# Patient Record
Sex: Female | Born: 1985 | Race: White | Hispanic: No | Marital: Married | Smoking: Never smoker
Health system: Southern US, Community
[De-identification: ages and names within clinical notes are randomized; demographics above are authoritative.]

## PROBLEM LIST (undated history)

## (undated) DIAGNOSIS — N200 Calculus of kidney: Secondary | ICD-10-CM

## (undated) DIAGNOSIS — R569 Unspecified convulsions: Secondary | ICD-10-CM

## (undated) DIAGNOSIS — Z3490 Encounter for supervision of normal pregnancy, unspecified, unspecified trimester: Secondary | ICD-10-CM

## (undated) DIAGNOSIS — M1990 Unspecified osteoarthritis, unspecified site: Secondary | ICD-10-CM

## (undated) DIAGNOSIS — K219 Gastro-esophageal reflux disease without esophagitis: Secondary | ICD-10-CM

## (undated) HISTORY — DX: Unspecified convulsions: R56.9

## (undated) HISTORY — DX: Calculus of kidney: N20.0

## (undated) HISTORY — PX: HERNIA REPAIR: SHX51

## (undated) HISTORY — PX: WISDOM TOOTH EXTRACTION: SHX21

## (undated) HISTORY — PX: EYE SURGERY: SHX253

## (undated) HISTORY — DX: Unspecified osteoarthritis, unspecified site: M19.90

---

## 2008-09-20 ENCOUNTER — Encounter: Admission: RE | Admit: 2008-09-20 | Discharge: 2008-09-20 | Payer: Self-pay | Admitting: Family Medicine

## 2009-03-30 ENCOUNTER — Ambulatory Visit: Payer: Self-pay | Admitting: Internal Medicine

## 2009-03-30 DIAGNOSIS — R209 Unspecified disturbances of skin sensation: Secondary | ICD-10-CM | POA: Insufficient documentation

## 2009-03-30 DIAGNOSIS — M064 Inflammatory polyarthropathy: Secondary | ICD-10-CM | POA: Insufficient documentation

## 2009-03-30 DIAGNOSIS — R259 Unspecified abnormal involuntary movements: Secondary | ICD-10-CM | POA: Insufficient documentation

## 2009-03-30 LAB — CONVERTED CEMR LAB
Albumin: 4.5 g/dL (ref 3.5–5.2)
Alkaline Phosphatase: 55 units/L (ref 39–117)
BUN: 12 mg/dL (ref 6–23)
CO2: 18 meq/L — ABNORMAL LOW (ref 19–32)
CRP: 0.1 mg/dL (ref <0.6)
Calcium: 9.5 mg/dL (ref 8.4–10.5)
Eosinophils Absolute: 0 10*3/uL (ref 0.0–0.7)
Eosinophils Relative: 0.8 % (ref 0.0–5.0)
Folate: 8.6 ng/mL
Glucose, Bld: 76 mg/dL (ref 70–99)
Lymphocytes Relative: 27.6 % (ref 12.0–46.0)
MCHC: 32.5 g/dL (ref 30.0–36.0)
MCV: 96.6 fL (ref 78.0–100.0)
Monocytes Absolute: 0.5 10*3/uL (ref 0.1–1.0)
Neutrophils Relative %: 62.8 % (ref 43.0–77.0)
Platelets: 159 10*3/uL (ref 150.0–400.0)
Sodium: 139 meq/L (ref 135–145)
TSH: 0.61 microintl units/mL (ref 0.35–5.50)
Total Bilirubin: 0.6 mg/dL (ref 0.3–1.2)
Total Protein: 7.6 g/dL (ref 6.0–8.3)
Vitamin B-12: 349 pg/mL (ref 211–911)
WBC: 6 10*3/uL (ref 4.5–10.5)

## 2009-04-02 ENCOUNTER — Encounter: Payer: Self-pay | Admitting: Internal Medicine

## 2009-04-10 ENCOUNTER — Ambulatory Visit (HOSPITAL_COMMUNITY): Admission: RE | Admit: 2009-04-10 | Discharge: 2009-04-10 | Payer: Self-pay | Admitting: Internal Medicine

## 2009-05-04 ENCOUNTER — Ambulatory Visit: Payer: Self-pay | Admitting: Internal Medicine

## 2010-04-16 NOTE — Assessment & Plan Note (Signed)
Summary: NEW/ [employer] /NWS #   Vital Signs:  Patient profile:   25 year old female Menstrual status:  regular LMP:     03/29/2009 Height:      69 inches Weight:      129 pounds BMI:     19.12 O2 Sat:      98 % on Room air Temp:     98.1 degrees F oral Pulse rate:   78 / minute Pulse rhythm:   regular Resp:     16 per minute BP sitting:   110 / 74  (left arm) Cuff size:   regular  Vitals Entered By: Rock Nephew CMA (March 30, 2009 3:07 PM)  O2 Flow:  Room air LMP (date): 03/29/2009     Menstrual Status regular Enter LMP: 03/29/2009 Last PAP Result abnormal   Primary Care Provider:  Etta Grandchild MD   History of Present Illness: New to me she complains of symptoms since 2008 when she saw a Neurologist and Sports Med . doctor for "growth spurts" and joint pain. She reports having a normal NCS done and a neg. Rheumatoid Factor. She was treated with Meloxicam and Lyrica but has gone on to develop burning under her skin with weakness, numbness, incoordination, and tingling.  5 days prior to this visit she developed "tremors' that migrated from her right hand and foot over to the left side of her body. Those tremors resolved 2 days ago and have not returned.  Preventive Screening-Counseling & Management  Alcohol-Tobacco     Alcohol drinks/day: 0     Smoking Status: never  Caffeine-Diet-Exercise     Does Patient Exercise: yes  Hep-HIV-STD-Contraception     Hepatitis Risk: no risk noted     HIV Risk: no risk noted     STD Risk: no risk noted      Sexual History:  currently monogamous.        Drug Use:  no.        Blood Transfusions:  no.    Current Medications (verified): 1)  Aviane 0.1-20 Mg-Mcg Tabs (Levonorgestrel-Ethinyl Estrad)  Allergies (verified): No Known Drug Allergies  Past History:  Past Medical History: Unremarkable  Past Surgical History: Inguinal herniorrhaphy Eye surgery  Family History: Family History of Arthritis Family History  Osteoporosis  Social History: Occupation: Environmental health practitioner Single Never Smoked Alcohol use-no Drug use-no Regular exercise-yes Smoking Status:  never Hepatitis Risk:  no risk noted HIV Risk:  no risk noted STD Risk:  no risk noted Sexual History:  currently monogamous Blood Transfusions:  no Drug Use:  no Does Patient Exercise:  yes  Review of Systems  The patient denies anorexia, fever, weight loss, weight gain, chest pain, syncope, dyspnea on exertion, peripheral edema, prolonged cough, headaches, hemoptysis, abdominal pain, difficulty walking, depression, and enlarged lymph nodes.   MS:  Complains of joint pain; denies joint redness, joint swelling, loss of strength, low back pain, muscle aches, muscle, muscle weakness, and stiffness. Neuro:  Complains of disturbances in coordination, numbness, poor balance, tingling, tremors, and weakness; denies headaches, memory loss, seizures, sensation of room spinning, and visual disturbances.  Physical Exam  General:  alert, well-developed, well-nourished, well-hydrated, appropriate dress, normal appearance, healthy-appearing, cooperative to examination, and good hygiene.   Head:  normocephalic, atraumatic, no abnormalities observed, no abnormalities palpated, and no alopecia.   Eyes:  vision grossly intact, pupils equal, pupils round, and pupils reactive to light.   Ears:  R ear normal and L ear normal.  Mouth:  good dentition, no gingival abnormalities, no dental plaque, pharynx pink and moist, no erythema, no exudates, no posterior lymphoid hypertrophy, and no postnasal drip.   Neck:  supple, full ROM, no masses, no thyromegaly, no thyroid nodules or tenderness, no JVD, normal carotid upstroke, no carotid bruits, no cervical lymphadenopathy, and no neck tenderness.   Lungs:  normal respiratory effort, no intercostal retractions, no accessory muscle use, normal breath sounds, and no dullness.   Heart:  normal rate, regular  rhythm, no murmur, no gallop, no rub, and no JVD.   Abdomen:  soft, non-tender, normal bowel sounds, no distention, no masses, no guarding, no rigidity, no hepatomegaly, and no splenomegaly.   Msk:  normal ROM, no joint tenderness, no joint swelling, no joint warmth, no redness over joints, no joint deformities, no joint instability, and no crepitation.   Pulses:  R and L carotid,radial,femoral,dorsalis pedis and posterior tibial pulses are full and equal bilaterally Extremities:  No clubbing, cyanosis, edema, or deformity noted with normal full range of motion of all joints.   Neurologic:  No cranial nerve deficits noted. Station and gait are normal. Plantar reflexes are down-going bilaterally. DTRs are symmetrical throughout. Sensory, motor and coordinative functions appear intact. Skin:  turgor normal, color normal, no rashes, no suspicious lesions, no ecchymoses, no petechiae, no purpura, no ulcerations, and no edema.   Cervical Nodes:  no anterior cervical adenopathy and no posterior cervical adenopathy.   Axillary Nodes:  no R axillary adenopathy and no L axillary adenopathy.   Inguinal Nodes:  no R inguinal adenopathy and no L inguinal adenopathy.   Psych:  Oriented X3, memory intact for recent and remote, normally interactive, good eye contact, not depressed appearing, not agitated, not suicidal, not homicidal, and slightly anxious.     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) Assessment: New will look for inflammatory disease Orders: Venipuncture (54098) TLB-BMP (Basic Metabolic Panel-BMET) (80048-METABOL) TLB-CBC Platelet - w/Differential (85025-CBCD) TLB-Hepatic/Liver Function Pnl (80076-HEPATIC) TLB-TSH (Thyroid Stimulating Hormone) (84443-TSH) TLB-B12 + Folate Pnl (11914_78295-A21/HYQ) TLB-CRP-High Sensitivity  (C-Reactive Protein) (86140-FCRP) T-Antinuclear Antib (ANA) (65784-69629) TLB-Rheumatoid Factor (RA) (52841-LK) T-Ceruloplasmin (44010-27253)  Problem # 3:  PARESTHESIA (ICD-782.0) Assessment: New I am concerned about B12 deficiency, thyroid disease, MS, Wilson's Dz., abnormal lytes Orders: Venipuncture (66440) TLB-BMP (Basic Metabolic Panel-BMET) (80048-METABOL) TLB-CBC Platelet - w/Differential (85025-CBCD) TLB-Hepatic/Liver Function Pnl (80076-HEPATIC) TLB-TSH (Thyroid Stimulating Hormone) (84443-TSH) TLB-B12 + Folate Pnl (34742_59563-O75/IEP) TLB-CRP-High Sensitivity (C-Reactive Protein) (86140-FCRP) T-Antinuclear Antib (ANA) (32951-88416) TLB-Rheumatoid Factor (RA) (60630-ZS) T-Ceruloplasmin (01093-23557) Radiology Referral (Radiology)  Complete Medication List: 1)  Aviane 0.1-20 Mg-mcg Tabs (Levonorgestrel-ethinyl estrad)  Patient Instructions: 1)  Please schedule a follow-up appointment in 2 weeks.  Preventive Care Screening  Pap Smear:    Date:  07/15/2008    Results:  abnormal   Last Tetanus Booster:    Date:  03/17/1998    Results:  Historical    Appended Document: Orders Update    Clinical Lists Changes  Orders: Added new Test order of T- * Misc. Laboratory test [phone]) - Signed

## 2010-04-16 NOTE — Assessment & Plan Note (Signed)
Summary: GO OVER MRI AND LAB RESULTS/NWS  #   Vital Signs:  Patient profile:   25 year old female Menstrual status:  regular Height:      69 inches Weight:      124 pounds O2 Sat:      98 % on Room air Temp:     97.4 degrees F oral Pulse rate:   97 / minute Pulse rhythm:   regular Resp:     16 per minute BP sitting:   120 / 60  (left arm) Cuff size:   regular  Vitals Entered By: Rock Nephew CMA (May 04, 2009 10:32 AM)  O2 Flow:  Room air  Primary Care Provider:  Etta Grandchild MD   History of Present Illness: She returns for f/up, all testing so far has been normal. She has had some LBP and reports a sensation of warmth under her skin and feeling "stressed". She saw a holistic doctor yesterday who did extensive testing.  Preventive Screening-Counseling & Management  Alcohol-Tobacco     Alcohol drinks/day: 0     Smoking Status: never  Hep-HIV-STD-Contraception     Hepatitis Risk: no risk noted     HIV Risk: no risk noted     STD Risk: no risk noted      Sexual History:  currently monogamous.        Drug Use:  no.        Blood Transfusions:  no.    Clinical Review Panels:  Diabetes Management   Creatinine:  0.83 (03/30/2009)  CBC   WBC:  6.0 (03/30/2009)   RBC:  4.77 (03/30/2009)   Hgb:  15.0 (03/30/2009)   Hct:  46.1 (03/30/2009)   Platelets:  159.0 (03/30/2009)   MCV  96.6 (03/30/2009)   MCHC  32.5 (03/30/2009)   RDW  12.3 (03/30/2009)   PMN:  62.8 (03/30/2009)   Lymphs:  27.6 (03/30/2009)   Monos:  8.2 (03/30/2009)   Eosinophils:  0.8 (03/30/2009)   Basophil:  0.6 (03/30/2009)  Complete Metabolic Panel   Glucose:  76 (03/30/2009)   Sodium:  139 (03/30/2009)   Potassium:  4.2 (03/30/2009)   Chloride:  105 (03/30/2009)   CO2:  18 (03/30/2009)   BUN:  12 (03/30/2009)   Creatinine:  0.83 (03/30/2009)   Albumin:  4.5 (03/30/2009)   Total Protein:  7.6 (03/30/2009)   Calcium:  9.5 (03/30/2009)   Total Bili:  0.6 (03/30/2009)   Alk Phos:   55 (03/30/2009)   SGPT (ALT):  11 (03/30/2009)   SGOT (AST):  19 (03/30/2009)   Medications Prior to Update: 1)  Aviane 0.1-20 Mg-Mcg Tabs (Levonorgestrel-Ethinyl Estrad)  Current Medications (verified): 1)  Aviane 0.1-20 Mg-Mcg Tabs (Levonorgestrel-Ethinyl Estrad)  Allergies (verified): No Known Drug Allergies  Past History:  Past Medical History: Reviewed history from 03/30/2009 and no changes required. Unremarkable  Past Surgical History: Reviewed history from 03/30/2009 and no changes required. Inguinal herniorrhaphy Eye surgery  Family History: Reviewed history from 03/30/2009 and no changes required. Family History of Arthritis Family History Osteoporosis  Social History: Reviewed history from 03/30/2009 and no changes required. Occupation: Environmental health practitioner Single Never Smoked Alcohol use-no Drug use-no Regular exercise-yes  Review of Systems  The patient denies anorexia, fever, weight loss, weight gain, chest pain, syncope, dyspnea on exertion, prolonged cough, headaches, hemoptysis, abdominal pain, suspicious skin lesions, transient blindness, difficulty walking, depression, enlarged lymph nodes, and angioedema.   MS:  Complains of joint pain, low back pain, and muscle aches; denies  joint redness, joint swelling, loss of strength, muscle weakness, stiffness, and thoracic pain. Neuro:  Denies brief paralysis, difficulty with concentration, disturbances in coordination, falling down, headaches, inability to speak, numbness, poor balance, sensation of room spinning, tingling, tremors, visual disturbances, and weakness. Psych:  Complains of anxiety; denies alternate hallucination ( auditory/visual), depression, easily angered, easily tearful, irritability, panic attacks, sense of great danger, suicidal thoughts/plans, thoughts of violence, unusual visions or sounds, and thoughts /plans of harming others.  Physical Exam  General:  alert, well-developed,  well-nourished, well-hydrated, appropriate dress, normal appearance, healthy-appearing, cooperative to examination, and good hygiene.   Head:  normocephalic, atraumatic, no abnormalities observed, no abnormalities palpated, and no alopecia.   Eyes:  vision grossly intact, pupils equal, pupils round, and pupils reactive to light.   Mouth:  good dentition, no gingival abnormalities, no dental plaque, pharynx pink and moist, no erythema, no exudates, no posterior lymphoid hypertrophy, and no postnasal drip.   Neck:  supple, full ROM, no masses, no thyromegaly, no thyroid nodules or tenderness, no JVD, normal carotid upstroke, no carotid bruits, no cervical lymphadenopathy, and no neck tenderness.   Lungs:  normal respiratory effort, no intercostal retractions, no accessory muscle use, normal breath sounds, and no dullness.   Heart:  normal rate, regular rhythm, no murmur, no gallop, no rub, and no JVD.   Abdomen:  soft, non-tender, normal bowel sounds, no distention, no masses, no guarding, no rigidity, no hepatomegaly, and no splenomegaly.   Msk:  normal ROM, no joint tenderness, no joint swelling, no joint warmth, no redness over joints, no joint deformities, no joint instability, and no crepitation.   Pulses:  R and L carotid,radial,femoral,dorsalis pedis and posterior tibial pulses are full and equal bilaterally Extremities:  No clubbing, cyanosis, edema, or deformity noted with normal full range of motion of all joints.   Neurologic:  No cranial nerve deficits noted. Station and gait are normal. Plantar reflexes are down-going bilaterally. DTRs are symmetrical throughout. Sensory, motor and coordinative functions appear intact. Skin:  turgor normal, color normal, no rashes, no suspicious lesions, no ecchymoses, no petechiae, no purpura, no ulcerations, and no edema.   Cervical Nodes:  no anterior cervical adenopathy and no posterior cervical adenopathy.   Psych:  Oriented X3, memory intact for recent  and remote, normally interactive, good eye contact, not depressed appearing, not agitated, not suicidal, not homicidal, and slightly anxious.     Impression & Recommendations:  Problem # 1:  PARESTHESIA (ICD-782.0) Assessment Improved there is no evidence of pathology thus far so I think she is having an adjustment reaction to stress, she wants to wait and see the results of the holistic testing before preceeding any further with me  Problem # 2:  OTH SPECIFIED INFLAMMATORY POLYARTHROPATHIES OTH (ICD-714.89) Assessment: Unchanged  Complete Medication List: 1)  Aviane 0.1-20 Mg-mcg Tabs (Levonorgestrel-ethinyl estrad)  Patient Instructions: 1)  Please schedule a follow-up appointment in 2 months.

## 2010-04-16 NOTE — Letter (Signed)
Summary: Results Follow-up Letter  [facility]  [address] [place], [place] [address]   Phone: [phone]  Fax: [phone]    04/02/2009  [address] [place], [place]  [address]  Dear Brittany Mccarthy,   The following are the results of your recent test(s):  Test     Result     CBC       normal Liver/kidney   normal B12       normal Inflamm. tests   all normal Thyroid     normal Urine       normal   _________________________________________________________  Please call for an appointment as directed _________________________________________________________ _________________________________________________________ _________________________________________________________  Sincerely,  Sanda Linger MD [facility]

## 2011-07-29 ENCOUNTER — Telehealth: Payer: Self-pay

## 2011-07-29 NOTE — Telephone Encounter (Signed)
Patient called [facility] stating that she will be leaving for a cruise on Friday. Patient is requesting a rx for patch to help with motion sickness thanks

## 2011-07-30 MED ORDER — SCOPOLAMINE 1 MG/3DAYS TD PT72: 1.0000 | MEDICATED_PATCH | TRANSDERMAL | Status: AC

## 2011-07-30 NOTE — Telephone Encounter (Signed)
Done. Thx.

## 2011-07-31 NOTE — Telephone Encounter (Signed)
Pt advised, Rx faxed to pharmacy on file.

## 2011-07-31 NOTE — Telephone Encounter (Signed)
Left message on machine for pt to return my call  

## 2014-06-16 ENCOUNTER — Ambulatory Visit (INDEPENDENT_AMBULATORY_CARE_PROVIDER_SITE_OTHER): Payer: BLUE CROSS/BLUE SHIELD | Admitting: Medical

## 2014-06-16 ENCOUNTER — Ambulatory Visit (HOSPITAL_BASED_OUTPATIENT_CLINIC_OR_DEPARTMENT_OTHER)
Admission: RE | Admit: 2014-06-16 | Discharge: 2014-06-16 | Disposition: A | Discharge status: Home / Self Care | Payer: BLUE CROSS/BLUE SHIELD | Source: Ambulatory Visit | Attending: Medical | Admitting: Medical

## 2014-06-16 ENCOUNTER — Encounter: Payer: Self-pay | Admitting: Medical

## 2014-06-16 ENCOUNTER — Telehealth: Payer: Self-pay | Admitting: Medical

## 2014-06-16 VITALS — BP 123/73 | HR 97 | Temp 98.2°F | Ht 70.5 in | Wt 152.6 lb

## 2014-06-16 DIAGNOSIS — M25561 Pain in right knee: Secondary | ICD-10-CM

## 2014-06-16 DIAGNOSIS — D696 Thrombocytopenia, unspecified: Secondary | ICD-10-CM

## 2014-06-16 DIAGNOSIS — M2550 Pain in unspecified joint: Secondary | ICD-10-CM | POA: Diagnosis not present

## 2014-06-16 DIAGNOSIS — R5383 Other fatigue: Secondary | ICD-10-CM | POA: Diagnosis not present

## 2014-06-16 LAB — COMPREHENSIVE METABOLIC PANEL
ALK PHOS: 57 U/L (ref 39–117)
ALT: 13 U/L (ref 0–35)
AST: 18 U/L (ref 0–37)
Albumin: 3.8 g/dL (ref 3.5–5.2)
BILIRUBIN TOTAL: 0.3 mg/dL (ref 0.2–1.2)
BUN: 12 mg/dL (ref 6–23)
CO2: 28 mEq/L (ref 19–32)
CREATININE: 0.83 mg/dL (ref 0.40–1.20)
Calcium: 9.4 mg/dL (ref 8.4–10.5)
Chloride: 106 mEq/L (ref 96–112)
GFR: 85.83 mL/min (ref >60.00)
Glucose, Bld: 94 mg/dL (ref 70–99)
Potassium: 4.4 mEq/L (ref 3.5–5.1)
SODIUM: 138 meq/L (ref 135–145)
TOTAL PROTEIN: 6.7 g/dL (ref 6.0–8.3)

## 2014-06-16 LAB — CBC WITH DIFFERENTIAL/PLATELET
BASOS PCT: 0.4 % (ref 0.0–3.0)
Basophils Absolute: 0 10*3/uL (ref 0.0–0.1)
EOS PCT: 0.7 % (ref 0.0–5.0)
Eosinophils Absolute: 0.1 10*3/uL (ref 0.0–0.7)
HCT: 40.3 % (ref 36.0–46.0)
Hemoglobin: 13.7 g/dL (ref 12.0–15.0)
LYMPHS PCT: 16.1 % (ref 12.0–46.0)
Lymphs Abs: 1.5 10*3/uL (ref 0.7–4.0)
MCHC: 34 g/dL (ref 30.0–36.0)
MCV: 92.1 fl (ref 78.0–100.0)
MONO ABS: 0.6 10*3/uL (ref 0.1–1.0)
Monocytes Relative: 6.9 % (ref 3.0–12.0)
NEUTROS PCT: 75.9 % (ref 43.0–77.0)
Neutro Abs: 6.8 10*3/uL (ref 1.4–7.7)
Platelets: 141 10*3/uL — ABNORMAL LOW (ref 150.0–400.0)
RBC: 4.37 Mil/uL (ref 3.87–5.11)
RDW: 13.4 % (ref 11.5–15.5)
WBC: 9 10*3/uL (ref 4.0–10.5)

## 2014-06-16 LAB — TSH: TSH: 1.15 u[IU]/mL (ref 0.35–4.50)

## 2014-06-16 LAB — C-REACTIVE PROTEIN: CRP: 1.3 mg/dL (ref 0.5–20.0)

## 2014-06-16 LAB — RHEUMATOID FACTOR: Rheumatoid fact SerPl-aCnc: <10 [IU]/mL

## 2014-06-16 LAB — SEDIMENTATION RATE: SED RATE: 8 mm/h (ref 0–22)

## 2014-06-16 NOTE — Assessment & Plan Note (Signed)
Get cbc, tsh, and cmp today.

## 2014-06-16 NOTE — Progress Notes (Signed)
Subjective:    Patient ID: Brittany Mccarthy, female    DOB: 1985/06/08, 29 y.o.   MRN: [id number]  HPI  I have reviewed pt PMH, PSH, FH, Social History and Surgical History.  Pt has not been to primary care in 5 yrs. Last seen last By Dr. Yetta [facility]reJones [facility].  Polyathropathies- Pt states in past for various joint pain  saw sports medicine. 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 She was given cortisone, lyrica and methotrexate. She did not like the way meds made her feel. No hx of sport participation. No hx of sports injuries. Total time joint pain for 12 yrs. Last 8 yrs progressive worse.  Pt still has daily joint pain hands, elbows, knees, ankle and hips all bilaterally. Pt states of all areas rt knee worse than lt. Pt takes only advil. Helps some. Level 3-5/10 daily basis.  Tremors- Hx of some rt hand tremors. Then if controlled rt side side would switch to lt side. Then if controlled lt side hand tremor her rt upper ext tremor her legs would tremor. Work up for that in 2011 was negative. Pt had mri for that in 2011 and it was neg. But she never saw neurologist. Those tremor events last about 5 days and the stopped. She states will only get mild variation occasionaly if she gets really cold. 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   LMP- May 17, 2014. Normal came on expected duration and amount.  Some mild fatigue past 6 weeks.   Review of Systems  Constitutional: Positive for fatigue. Negative for fever and chills.       Some mild daily fatigue for 6 wks.  HENT: Negative.   Respiratory: Negative for cough, chest tightness, shortness of breath and wheezing.   Cardiovascular: Negative for chest pain.  Gastrointestinal: Negative for abdominal pain, diarrhea,  constipation and blood in stool.  Genitourinary: Negative for dysuria, frequency, hematuria and flank pain.  Musculoskeletal: Positive for arthralgias.  Neurological: Negative for dizziness, seizures, facial asymmetry, weakness, light-headedness, numbness and headaches.       No tremors.  Hematological: Negative for adenopathy. Does not bruise/bleed easily.  Psychiatric/Behavioral: Negative for suicidal ideas, behavioral problems, confusion, sleep disturbance, dysphoric mood and agitation. The patient is not hyperactive.    Past Medical History  Diagnosis Date  . Arthritis   . Seizures     History   Social History  . Marital Status: Single    Spouse Name: N/A  . Number of Children: N/A  . Years of Education: N/A   Occupational History  . Not on file.   Social History Main Topics  . Smoking status: Never Smoker   . Smokeless tobacco: Never Used  . Alcohol Use: No  . Drug Use: Not on file  . Sexual Activity: Not on file   Other Topics Concern  . Not on file   Social History Narrative  . No narrative on file    Past Surgical History  Procedure Laterality Date  . Eye surgery    . Hernia repair      No family history on file.  No Known Allergies  No current outpatient prescriptions on file prior to visit.   No current facility-administered medications on file prior to visit.  BP 123/73 mmHg  Pulse 97  Temp(Src) 98.2 F (36.8 C) (Oral)  Ht 5' 10.5" (1.791 m)  Wt 152 lb 9.6 oz (69.219 kg)  BMI 21.58 kg/m2  SpO2 97%  LMP 05/17/2014       Objective:   Physical Exam  General Mental Status- Alert. General Appearance- Not in acute distress.   Skin General: Color- Normal Color. Moisture- Normal Moisture.  Neck Carotid Arteries- Normal color. Moisture- Normal Moisture. No carotid bruits. No JVD.  Chest and Lung Exam Auscultation: Breath Sounds:-Normal.  Cardiovascular Auscultation:Rythm- Regular. Murmurs & Other Heart Sounds:Auscultation of the  heart reveals- No Murmurs.  Abdomen Inspection:-Inspeection Normal. Palpation/Percussion:Note:No mass. Palpation and Percussion of the abdomen reveal- Non Tender, Non Distended + BS, no rebound or guarding.    Neurologic Cranial Nerve exam:- CN III-XII intact(No nystagmus), symmetric smile. Strength:- 5/5 equal and symmetric strength both upper and lower extremities.(no tremors seen)  Joints/musculoskeletal exam.   Elbows- from, no crepitus or swelling but reports some pain. Hips- from. Mild pain. Knees- lt side mild pain on rom, no instabilty. No crepitus.  Rt knee- severe crepitus, mild swelling. Moderate pain on rom. No instability.    Assessment & Plan:

## 2014-06-16 NOTE — Assessment & Plan Note (Signed)
Hx of will watch this closely if occuring again and longer may refer to neurology. For evaluaion of possible atypical petit mal seizure?

## 2014-06-16 NOTE — Patient Instructions (Signed)
TREMOR Hx of will watch this closely if occuring again and longer may refer to neurology. For evaluaion of possible atypical petit mal seizure?   Arthralgia Various joints worse rt knee. Will get arthritis panel and xray of rt knee. Advise use alleve otc 2 tab twice daily. Stop ibuprofen.  May refer to rheumatoligist near future.   Fatigue Get cbc, tsh, and cmp today.    Follow up in 1 month or as needed.

## 2014-06-16 NOTE — Progress Notes (Signed)
Pre visit review using our clinic review tool, if applicable. No additional management support is needed unless otherwise documented below in the visit note. 

## 2014-06-16 NOTE — Telephone Encounter (Signed)
Order to repeat cbc in 10 days.

## 2014-06-16 NOTE — Assessment & Plan Note (Signed)
Various joints worse rt knee. Will get arthritis panel and xray of rt knee. Advise use alleve otc 2 tab twice daily. Stop ibuprofen.  May refer to rheumatoligist near future.

## 2014-06-19 LAB — ANA: Anti Nuclear Antibody(ANA): NEGATIVE

## 2014-06-26 NOTE — Telephone Encounter (Signed)
Called patient left message to call so we coulfd schedule appointment for CBC to be drawn.

## 2014-06-29 ENCOUNTER — Encounter: Payer: Self-pay | Admitting: Medical

## 2014-06-29 NOTE — Progress Notes (Unsigned)
Called patient informed order placed in system for repeat CBC. Patient advised to call and be placed on LAB schedule at her earliest convenience.

## 2014-06-30 ENCOUNTER — Other Ambulatory Visit (INDEPENDENT_AMBULATORY_CARE_PROVIDER_SITE_OTHER): Payer: BLUE CROSS/BLUE SHIELD

## 2014-06-30 DIAGNOSIS — D696 Thrombocytopenia, unspecified: Secondary | ICD-10-CM | POA: Diagnosis not present

## 2014-06-30 LAB — CBC WITH DIFFERENTIAL/PLATELET
BASOS ABS: 0 10*3/uL (ref 0.0–0.1)
BASOS PCT: 0.4 % (ref 0.0–3.0)
EOS ABS: 0.2 10*3/uL (ref 0.0–0.7)
Eosinophils Relative: 2.3 % (ref 0.0–5.0)
HCT: 43.2 % (ref 36.0–46.0)
HEMOGLOBIN: 14.7 g/dL (ref 12.0–15.0)
LYMPHS ABS: 2.5 10*3/uL (ref 0.7–4.0)
LYMPHS PCT: 35.1 % (ref 12.0–46.0)
MCHC: 34 g/dL (ref 30.0–36.0)
MCV: 92.1 fl (ref 78.0–100.0)
MONO ABS: 0.5 10*3/uL (ref 0.1–1.0)
Monocytes Relative: 6.6 % (ref 3.0–12.0)
NEUTROS ABS: 4 10*3/uL (ref 1.4–7.7)
Neutrophils Relative %: 55.6 % (ref 43.0–77.0)
Platelets: 207 10*3/uL (ref 150.0–400.0)
RBC: 4.69 Mil/uL (ref 3.87–5.11)
RDW: 13.5 % (ref 11.5–15.5)
WBC: 7.2 10*3/uL (ref 4.0–10.5)

## 2014-07-17 ENCOUNTER — Encounter: Payer: Self-pay | Admitting: Medical

## 2014-07-17 ENCOUNTER — Ambulatory Visit (INDEPENDENT_AMBULATORY_CARE_PROVIDER_SITE_OTHER): Payer: BLUE CROSS/BLUE SHIELD | Admitting: Medical

## 2014-07-17 VITALS — BP 111/73 | HR 80 | Temp 98.1°F | Ht 70.5 in | Wt 149.2 lb

## 2014-07-17 DIAGNOSIS — M25561 Pain in right knee: Secondary | ICD-10-CM

## 2014-07-17 DIAGNOSIS — M545 Low back pain, unspecified: Secondary | ICD-10-CM | POA: Insufficient documentation

## 2014-07-17 DIAGNOSIS — R5383 Other fatigue: Secondary | ICD-10-CM | POA: Diagnosis not present

## 2014-07-17 DIAGNOSIS — M2550 Pain in unspecified joint: Secondary | ICD-10-CM

## 2014-07-17 NOTE — Assessment & Plan Note (Signed)
None currently but she did mention as update to her history so documented.   She mentions hx also of occaisonal lower back pain. She mentions(thinks) related to being tall. Occasionally she goes to chiropractor and this helps. No current exacerbation.

## 2014-07-17 NOTE — Patient Instructions (Signed)
TREMOR No exacerbation recently. Imaging studies done MRI and CT in past. Those reviewed. If this worsens will refer to neurologist.   Fatigue Labs negative and she is no longer fatigued despite much busier schedule. So will not pursue further work up.   Arthralgia Diffuse arthralgias subsided some. Arthritis panel negative. If worsens repeat labs in future or refer to rheumatologist.   Knee pain, right Mild intermittent but with severe crepitus on exam today. Some mild joint space narrowing on xrays. I think it may be beneficial to establish with orthorpedist. Pt very active with exercise.    Follow up 1 yr or as needed(any worsening or changing signs or symptoms. [id number] could be wellness exam. Gyn portion could be done separate if you choose.

## 2014-07-17 NOTE — Assessment & Plan Note (Signed)
Mild intermittent but with severe crepitus on exam today. Some mild joint space narrowing on xrays. I think it may be beneficial to establish with orthorpedist. Pt very active with exercise.

## 2014-07-17 NOTE — Assessment & Plan Note (Signed)
Diffuse arthralgias subsided some. Arthritis panel negative. If worsens repeat labs in future or refer to rheumatologist.

## 2014-07-17 NOTE — Progress Notes (Signed)
Pre visit review using our clinic review tool, if applicable. No additional management support is needed unless otherwise documented below in the visit note. 

## 2014-07-17 NOTE — Assessment & Plan Note (Signed)
No exacerbation recently. Imaging studies done MRI and CT in past. Those reviewed. If this worsens will refer to neurologist.

## 2014-07-17 NOTE — Assessment & Plan Note (Signed)
Labs negative and she is no longer fatigued despite much busier schedule. So will not pursue further work up.

## 2014-07-17 NOTE — Progress Notes (Signed)
Subjective:    Patient ID: Brittany Mccarthy, female    DOB: 01-02-1986, 29 y.o.   MRN: [id number]  HPI  Pt in for follow up. Pt seen for fatigue, arthralgia, and tremors. See last note.Iniitialy seen last time first visit.  Fatigue work up was negative. Pt states even though she is working a lot more her energy level is a lot better.  Pt also mentioned some rt knee and some other arthralgias on last exam. Sed rate, crp and ana was normal. Pt rt knee hurts stings all the time. She points to tibial plateau area. She hears crepitus. Also various other arthralgias that are daily.  Pt has occasional rt hand tremor. Very rare twitch. Below is last summary regarding prior tremors.  Tremors- Hx of some rt hand tremors. Then if controlled rt side side would switch to lt side. Then if controlled lt side hand tremor her rt upper ext tremor her legs would tremor. Work up for that in 2011 was negative. Pt had mri for that in 2011 and it was neg. But she never saw neurologist. Those tremor events last about 5 days and the stopped. She states will only get mild variation occasionally if she gets really cold. Total since 2011 maybe 5-6 times and last only 5 minutes.(No confusion with these episodes)  She mentions hx also of occaisonal lower back pain. She mentions related to being tall. Occasionally she goes to chiropractor and this helps. No current exacerbation.    Review of Systems  Constitutional: Negative for fever, chills and fatigue.       Marland Kitchen  HENT: Negative.   Respiratory: Negative for cough, chest tightness, shortness of breath and wheezing.   Cardiovascular: Negative for chest pain.  Gastrointestinal: Negative for abdominal pain, diarrhea, constipation and blood in stool.  Genitourinary: Negative for dysuria, frequency, hematuria and flank pain.  Musculoskeletal: Positive for arthralgias.       Rt knee pain and other joints as stated on last hop.  Neurological: Negative for dizziness, seizures,  facial asymmetry, weakness, light-headedness, numbness and headaches.       No tremors.  Hematological: Negative for adenopathy. Does not bruise/bleed easily.  Psychiatric/Behavioral: Negative for suicidal ideas, behavioral problems, confusion, sleep disturbance, dysphoric mood and agitation. The patient is not hyperactive.    Past Medical History  Diagnosis Date  . Arthritis   . Seizures     History   Social History  . Marital Status: Single    Spouse Name: N/A  . Number of Children: N/A  . Years of Education: N/A   Occupational History  . Not on file.   Social History Main Topics  . Smoking status: Never Smoker   . Smokeless tobacco: Never Used  . Alcohol Use: No  . Drug Use: Not on file  . Sexual Activity: Not on file   Other Topics Concern  . Not on file   Social History Narrative    Past Surgical History  Procedure Laterality Date  . Eye surgery    . Hernia repair      No family history on file.  No Known Allergies  Current Outpatient Prescriptions on File Prior to Visit  Medication Sig Dispense Refill  . norgestimate-ethinyl estradiol (ORTHO-CYCLEN,SPRINTEC,PREVIFEM) 0.25-35 MG-MCG tablet Take 1 tablet by mouth daily.     No current facility-administered medications on file prior to visit.    BP 111/73 mmHg  Pulse 80  Temp(Src) 98.1 F (36.7 C) (Oral)  Ht 5' 10.5" (1.791 m)  Wt 149 lb 3.2 oz (67.677 kg)  BMI 21.10 kg/m2  SpO2 97%  LMP 06/19/2014      Objective:   Physical Exam  General- No acute distress. Pleasant patient. Neck- Full range of motion, no jvd Lungs- Clear, even and unlabored. Heart- regular rate and rhythm. Neurologic- CNII- XII grossly intact. Rt knee- severe crepitus today. No instability. No pain on palpation.      Assessment & Plan:

## 2015-02-05 ENCOUNTER — Ambulatory Visit (INDEPENDENT_AMBULATORY_CARE_PROVIDER_SITE_OTHER): Payer: BLUE CROSS/BLUE SHIELD | Admitting: Physician Assistant

## 2015-02-05 ENCOUNTER — Encounter: Payer: Self-pay | Admitting: Physician Assistant

## 2015-02-05 VITALS — BP 113/71 | HR 72 | Temp 98.1°F | Resp 16 | Ht 71.0 in | Wt 159.4 lb

## 2015-02-05 DIAGNOSIS — M25561 Pain in right knee: Secondary | ICD-10-CM | POA: Diagnosis not present

## 2015-02-05 MED ORDER — MELOXICAM 15 MG PO TABS: 15.0000 mg | ORAL_TABLET | Freq: Every day | ORAL | Status: DC

## 2015-02-05 NOTE — Patient Instructions (Signed)
Wear your compression knee sleeves daily. Ice and elevate knees while resting. Take the Mobic once daily as directed with food. Use ES Tylenol for breakthrough pain. Follow-up with Orthopedic Surgery once you return to clinic.

## 2015-02-05 NOTE — Progress Notes (Signed)
   Patient presents to clinic today c/o pain in R hip x 1 month that sometimes radiates into buttocks and right knee. Endorses bilateral knee swelling intermittently as well. Denies trauma or injury. Is a runner. Was previously referred to Orthopedic Surgery for OA noted on imaging. Patient was given cortisone shot for osteoarthritis several months ago. Has not followed up with Orthopedics.   Past Medical History  Diagnosis Date  . Arthritis   . Seizures (HCC)     Current Outpatient Prescriptions on File Prior to Visit  Medication Sig Dispense Refill  . norgestimate-ethinyl estradiol (ORTHO-CYCLEN,SPRINTEC,PREVIFEM) 0.25-35 MG-MCG tablet Take 1 tablet by mouth daily.     No current facility-administered medications on file prior to visit.    No Known Allergies  No family history on file.  Social History   Social History  . Marital Status: Single    Spouse Name: N/A  . Number of Children: N/A  . Years of Education: N/A   Social History Main Topics  . Smoking status: Never Smoker   . Smokeless tobacco: Never Used  . Alcohol Use: No  . Drug Use: None  . Sexual Activity: Not Asked   Other Topics Concern  . None   Social History Narrative   Review of Systems - See HPI.  All other ROS are negative.  BP 113/71 mmHg  Pulse 72  Temp(Src) 98.1 F (36.7 C) (Oral)  Resp 16  Ht 5\' 11"  (1.803 m)  Wt 159 lb 6 oz (72.292 kg)  BMI 22.24 kg/m2  SpO2 100%  LMP 02/01/2015  Physical Exam  Constitutional: She is oriented to person, place, and time and well-developed, well-nourished, and in no distress.  HENT:  Head: Normocephalic and atraumatic.  Eyes: Conjunctivae are normal.  Cardiovascular: Normal rate, normal heart sounds and intact distal pulses.   Pulmonary/Chest: Effort normal and breath sounds normal. No respiratory distress. She has no wheezes. She has no rales. She exhibits no tenderness.  Musculoskeletal:       Right hip: Normal.       Right knee: She exhibits  normal range of motion, no swelling, normal alignment and normal meniscus. Tenderness found. Patellar tendon tenderness noted.  Neurological: She is alert and oriented to person, place, and time.  Skin: Skin is warm and dry. No rash noted.  Vitals reviewed.  No results found for this or any previous visit (from the past 2160 hour(s)).  Assessment/Plan: Right knee pain Recent x-ray reveals signs of OA.  Recommend knee sleeves bilateral. RICE. RX Mobic 15 mg daily. Follow-up with Ortho if symptoms not resolving.

## 2015-02-05 NOTE — Assessment & Plan Note (Signed)
Recent x-ray reveals signs of OA.  Recommend knee sleeves bilateral. RICE. RX Mobic 15 mg daily. Follow-up with Ortho if symptoms not resolving.

## 2015-03-18 NOTE — L&D Delivery Note (Signed)
Operative Delivery Note At 8:41 PM a viable female was delivered via Vaginal, Spontaneous Delivery.  Presentation: vertex; Position: Occiput,, Anterior;  Of noted pt pushed for about 4 hours total  Delivery of the head: 12/07/2015  8:41 PM First maneuver: 12/07/2015  8:41 PM, McRoberts Second maneuver: 12/07/2015  8:41 PM, Posterior arm delivered Third maneuver: ,   Fourth maneuver: ,   Fifth maneuver: ,   Sixth maneuver: ,    Verbal consent: obtained from patient.  APGAR: 4, 9; weight  .   Placenta status: , .   Cord:  with the following complications: .  Cord pH: pending  Anesthesia:  epidural Episiotomy: None Lacerations: 2nd degree;Vaginal;Perineal Suture Repair: 2.0 vicryl and and 0 vicryl Est. Blood Loss (mL): 250  Mom to postpartum.  Baby to Couplet care / Skin to Skin. Small vaginal skin tag/mole removed per pt request Unable to note cervical polyp previously noted  Brittany Mccarthy 12/07/2015, 9:58 PM

## 2015-05-04 LAB — OB RESULTS CONSOLE ABO/RH: RH TYPE: POSITIVE

## 2015-05-04 LAB — OB RESULTS CONSOLE RUBELLA ANTIBODY, IGM: RUBELLA: IMMUNE

## 2015-05-04 LAB — OB RESULTS CONSOLE GC/CHLAMYDIA
CHLAMYDIA, DNA PROBE: NEGATIVE
Gonorrhea: NEGATIVE

## 2015-05-04 LAB — OB RESULTS CONSOLE HIV ANTIBODY (ROUTINE TESTING): HIV: NONREACTIVE

## 2015-05-04 LAB — OB RESULTS CONSOLE RPR: RPR: NONREACTIVE

## 2015-05-04 LAB — OB RESULTS CONSOLE ANTIBODY SCREEN: Antibody Screen: NEGATIVE

## 2015-05-04 LAB — OB RESULTS CONSOLE HEPATITIS B SURFACE ANTIGEN: HEP B S AG: NEGATIVE

## 2015-06-05 LAB — HM PAP SMEAR: HM Pap smear: NORMAL

## 2015-08-24 ENCOUNTER — Encounter: Payer: Self-pay | Admitting: Emergency Medicine

## 2015-08-24 ENCOUNTER — Emergency Department
Admission: EM | Admit: 2015-08-24 | Discharge: 2015-08-24 | Disposition: A | Discharge status: Home / Self Care | Payer: 59 | Source: Home / Self Care | Attending: Family Medicine | Admitting: Family Medicine

## 2015-08-24 DIAGNOSIS — H6983 Other specified disorders of Eustachian tube, bilateral: Secondary | ICD-10-CM | POA: Diagnosis not present

## 2015-08-24 HISTORY — DX: Encounter for supervision of normal pregnancy, unspecified, unspecified trimester: Z34.90

## 2015-08-24 NOTE — ED Notes (Signed)
Patient is [redacted] weeks pregnant and complains of 3 day history pressure and noise perception changes in left ear.

## 2015-08-24 NOTE — ED Provider Notes (Signed)
CSN: [id number]     Arrival date & time 08/24/15  1237 History   First MD Initiated Contact with Patient 08/24/15 1258     Chief Complaint  Patient presents with  . Ear Pain      HPI Comments: Patient complains of 3 day history of pressure and decreased hearing in her left ear.  No URI symptoms.  No fevers, chills, and sweats. She is [redacted] weeks pregnant.  The history is provided by the patient.    Past Medical History  Diagnosis Date  . Arthritis   . Seizures (HCC)   . Pregnant    Past Surgical History  Procedure Laterality Date  . Eye surgery    . Hernia repair     History reviewed. No pertinent family history. Social History  Substance Use Topics  . Smoking status: Never Smoker   . Smokeless tobacco: Never Used  . Alcohol Use: No   OB History    Gravida Para Term Preterm AB TAB SAB Ectopic Multiple Living   1              Review of Systems No sore throat No cough No pleuritic pain No wheezing + nasal congestion ? post-nasal drainage No sinus pain/pressure No itchy/red eyes ? earache No hemoptysis No SOB No fever/chills No nausea No vomiting No abdominal pain No diarrhea No urinary symptoms No skin rash + fatigue No myalgias No headache    Allergies  Review of patient's allergies indicates no known allergies.  Home Medications   Prior to Admission medications   Medication Sig Start Date End Date Taking? Authorizing Provider  meloxicam (MOBIC) 15 MG tablet Take 1 tablet (15 mg total) by mouth daily. 02/05/15   Waldon MerlWilliam C Martin, PA-C  norgestimate-ethinyl estradiol (ORTHO-CYCLEN,SPRINTEC,PREVIFEM) 0.25-35 MG-MCG tablet Take 1 tablet by mouth daily.    Historical Provider, MD   Meds Ordered and Administered this Visit  Medications - No data to display  BP 113/72 mmHg  Pulse 100  Temp(Src) 98 F (36.7 C) (Oral)  Resp 16  Ht 5' 9.5" (1.765 m)  Wt 190 lb (86.183 kg)  BMI 27.67 kg/m2  SpO2 97%  LMP 02/01/2015 No data found.   Physical  Exam Nursing notes and Vital Signs reviewed. Appearance:  Patient appears stated age, and in no acute distress Eyes:  Pupils are equal, round, and reactive to light and accomodation.  Extraocular movement is intact.  Conjunctivae are not inflamed  Ears:  Canals normal.  Tympanic membranes normal.  Nose:  Mildly congested turbinates.  No sinus tenderness.   Pharynx:  Normal Neck:  Supple.  No adenopathy Lungs:  Clear to auscultation.  Breath sounds are equal.  Moving air well. Heart:  Regular rate and rhythm without murmurs, rubs, or gallops.  Abdomen: Gravid; nontender Extremities:  No edema.  Skin:  No rash present.   ED Course  Procedures none    Labs Reviewed -   Tympanometry:  Normal both ears.    MDM   1. Eustachian tube dysfunction, bilateral    Reassurance; no evidence otitis media.  Recommend using saline nasal spray several times daily and saline nasal irrigation (AYR is a common brand).      Lattie HawStephen A Beese, MD 09/01/15 [phone]

## 2015-08-24 NOTE — Discharge Instructions (Signed)
Recommend using saline nasal spray several times daily and saline nasal irrigation (AYR is a common brand).

## 2015-08-28 ENCOUNTER — Telehealth: Payer: Self-pay | Admitting: *Deleted

## 2015-08-28 NOTE — ED Notes (Signed)
Callback: LMOM f/u from visit, call back as needed. F/u with ENT if not improving.

## 2015-10-26 LAB — OB RESULTS CONSOLE GBS: GBS: POSITIVE

## 2015-11-05 ENCOUNTER — Telehealth: Payer: Self-pay | Admitting: Medical

## 2015-11-05 NOTE — Telephone Encounter (Signed)
[facility] Day - Client TELEPHONE ADVICE RECORD [facility] Patient Name: Brittany Mccarthy DOB: 1985-07-25 Initial Comment Caller is [redacted] wk pregnant and [facility] a [facility] - she was told to take benadryl and zyrtec. The rash is continuing to spread. Nurse Assessment Nurse: Lane HackerHarley, RN, Elvin SoWindy Date/Time (Eastern Time): 11/05/2015 2:27:47 PM Confirm and document reason for call. If symptomatic, describe symptoms. You must click the next button to save text entered. ---Caller is [redacted] wk pregnant and [facility] widespread rash on arms/legs and on her belly. She was told to take benadryl and zyrtec. The rash is continuing to spreading, getting worse - increased itching. Some of them look like blistering but this may be from scratching. Hive looking appearance and also tiny pinkish red/spots and dots. No fever. Has the patient traveled out of the country within the last 30 days? ---Not Applicable Does the patient have any new or worsening symptoms? ---Yes Will a triage be completed? ---Yes Related visit to physician within the last 2 weeks? ---Yes Does the PT have any chronic conditions? (i.e. diabetes, asthma, etc.) ---No Is the patient pregnant or possibly pregnant? (Ask all females between the ages of 7412-55) ---Yes How many weeks gestation? ---6536 What is the estimated delivery date? ---2015-11-30 Total number of pregnancies including current? ---1 Number of live births? ---0 Have you felt decreased fetal movement? ---No Is this a behavioral health or substance abuse call? ---No Guidelines Guideline Title Affirmed Question Affirmed Notes Hives [1] MODERATE-SEVERE hives persist (i.e., hives interfere with normal activities or work) AND [2] taking antihistamine (e.g., Benadryl, Claritin) > 24 hours Final Disposition User See Physician within 24 Hours BoltHarley, Charity fundraiserN, Windy Comments Appt made for tomorrow 11 am with  Selena Battenody PA Referrals REFERRED TO PCP OFFICE PLEASE NOTE: All timestamps contained within this report are represented as [place] Standard Time. CONFIDENTIALTY NOTICE: This fax transmission is intended only for the addressee. It contains information that is legally privileged, confidential or otherwise protected from use or disclosure. If you are not the intended recipient, you are strictly prohibited from reviewing, disclosing, copying using or disseminating any of this information or taking any action in reliance on or regarding this information. If you have received this fax in error, please notify us immediately by telephone so that we can arrange for its return to us. Phone: [phone], Toll-Free: [phone], Fax: [phone] Page: 2 of 2 Call Id: [id number]  Disagree/Comply: Danella Maiersomply

## 2015-11-05 NOTE — Telephone Encounter (Addendum)
Pt has appt with  Marcelline MatesWilliam Martin PA 11/06/15 at 11AM.

## 2015-11-06 ENCOUNTER — Ambulatory Visit: Payer: Self-pay | Admitting: Medical

## 2015-11-06 ENCOUNTER — Telehealth: Payer: Self-pay | Admitting: Medical

## 2015-11-06 ENCOUNTER — Ambulatory Visit: Payer: Self-pay | Admitting: Physician Assistant

## 2015-11-06 NOTE — Telephone Encounter (Signed)
Victorino DikeJennifer called pt and explained that OB needs to see her this late in pregnancy for her condition. Pt was agreeable. We offered to refer pt. Pt agreed to call over to OB to be seen today.

## 2015-11-06 NOTE — Telephone Encounter (Signed)
Spoke with patient, agreeable to call ob and make appointment today.

## 2015-11-06 NOTE — Telephone Encounter (Signed)
Pt preganant 36 weeks. Diffuse itching and rash. Bring up possibility of cholestatasis. Pt should be seen by OB this late in pregnancy. Would you call pt and see who OB is. Then I will make referral. This late in pregnancy I think OB needs to see and not pcp. Let pt know this and that I will talk with their office. Even if they did not do much last visit. They may expand work up such as lft. They may assess status of baby as well. So please get me name so we can talk [facility] office before she tries to head over here.

## 2015-11-06 NOTE — Telephone Encounter (Signed)
Lm on vm, awaiting return call °

## 2015-11-30 ENCOUNTER — Encounter (HOSPITAL_COMMUNITY): Payer: Self-pay | Admitting: *Deleted

## 2015-11-30 ENCOUNTER — Inpatient Hospital Stay (HOSPITAL_COMMUNITY): Admission: AD | Admit: 2015-11-30 | Payer: 59 | Source: Ambulatory Visit | Admitting: Obstetrics and Gynecology

## 2015-11-30 ENCOUNTER — Telehealth (HOSPITAL_COMMUNITY): Payer: Self-pay | Admitting: *Deleted

## 2015-11-30 NOTE — Telephone Encounter (Signed)
Preadmission screen  

## 2015-12-05 NOTE — H&P (Signed)
Brittany Mccarthy is a 30 y.o. female, G1P0, EGA 40+ weeks with EDC 9-15 presenting for cervical ripening and induction.  Prenatal care complicated by gestational proteinuria with nl BPs, and a large endocervical polyp.  OB History    Gravida Para Term Preterm AB Living   1             SAB TAB Ectopic Multiple Live Births                 Past Medical History:  Diagnosis Date  . Arthritis   . Pregnant   . Seizures (HCC)    Past Surgical History:  Procedure Laterality Date  . EYE SURGERY    . HERNIA REPAIR    . WISDOM TOOTH EXTRACTION     Family History: family history is not on file. Social History:  reports that she has never smoked. She has never used smokeless tobacco. She reports that she does not drink alcohol. Her drug history is not on file.     Maternal Diabetes: No Genetic Screening: Normal Maternal Ultrasounds/Referrals: Normal Fetal Ultrasounds or other Referrals:  None Maternal Substance Abuse:  No Significant Maternal Medications:  None Significant Maternal Lab Results:  Lab values include: Group B Strep positive Other Comments:  None  Review of Systems  Respiratory: Negative.   Cardiovascular: Negative.    Maternal Medical History:  Fetal activity: Perceived fetal activity is normal.        Last menstrual period 02/23/2015. Maternal Exam:  Uterine Assessment: Contraction strength is mild.  Contraction frequency is irregular.   Abdomen: Patient reports no abdominal tenderness. Estimated fetal weight is 7 1/2 lbs.   Fetal presentation: vertex  Introitus: Normal vulva. Normal vagina.  Amniotic fluid character: not assessed.  Pelvis: adequate for delivery.   Cervix: Cervix evaluated by sterile speculum exam.     Physical Exam  Constitutional: She appears well-developed and well-nourished.  Cardiovascular: Normal rate.   Respiratory: Effort normal and breath sounds normal. No respiratory distress.    Prenatal labs: ABO, Rh: A/Positive/-- (02/17  0000) Antibody: Negative (02/17 0000) Rubella: Immune (02/17 0000) RPR: Nonreactive (02/17 0000)  HBsAg: Negative (02/17 0000)  HIV: Non-reactive (02/17 0000)  GBS: Positive (08/11 0000)   Assessment/Plan: IUP at 40+ weeks, GBS +, for cervical ripening and induction.  Will admit for ripening with cytotec, evaluate progress in the am for further plan, start PCN for +GBS.   Kiptyn Rafuse D 12/05/2015, 6:29 PM

## 2015-12-06 ENCOUNTER — Encounter (HOSPITAL_COMMUNITY): Payer: Self-pay

## 2015-12-06 ENCOUNTER — Inpatient Hospital Stay (HOSPITAL_COMMUNITY)
Admission: RE | Admit: 2015-12-06 | Discharge: 2015-12-09 | DRG: 775 | Disposition: A | Discharge status: Home / Self Care | Payer: 59 | Source: Ambulatory Visit | Attending: Obstetrics and Gynecology | Admitting: Obstetrics and Gynecology

## 2015-12-06 DIAGNOSIS — O3443 Maternal care for other abnormalities of cervix, third trimester: Secondary | ICD-10-CM | POA: Diagnosis present

## 2015-12-06 DIAGNOSIS — N841 Polyp of cervix uteri: Secondary | ICD-10-CM | POA: Diagnosis present

## 2015-12-06 DIAGNOSIS — L918 Other hypertrophic disorders of the skin: Secondary | ICD-10-CM | POA: Diagnosis present

## 2015-12-06 DIAGNOSIS — O99824 Streptococcus B carrier state complicating childbirth: Secondary | ICD-10-CM | POA: Diagnosis present

## 2015-12-06 DIAGNOSIS — O480 Post-term pregnancy: Secondary | ICD-10-CM | POA: Diagnosis present

## 2015-12-06 DIAGNOSIS — Z3A40 40 weeks gestation of pregnancy: Secondary | ICD-10-CM

## 2015-12-06 DIAGNOSIS — Z3493 Encounter for supervision of normal pregnancy, unspecified, third trimester: Secondary | ICD-10-CM

## 2015-12-06 DIAGNOSIS — Z3403 Encounter for supervision of normal first pregnancy, third trimester: Secondary | ICD-10-CM | POA: Diagnosis present

## 2015-12-06 LAB — CBC
HEMATOCRIT: 39.4 % (ref 36.0–46.0)
HEMOGLOBIN: 13.7 g/dL (ref 12.0–15.0)
MCH: 33.3 pg (ref 26.0–34.0)
MCHC: 34.8 g/dL (ref 30.0–36.0)
MCV: 95.9 fL (ref 78.0–100.0)
Platelets: 140 10*3/uL — ABNORMAL LOW (ref 150–400)
RBC: 4.11 MIL/uL (ref 3.87–5.11)
RDW: 13.8 % (ref 11.5–15.5)
WBC: 11 10*3/uL — ABNORMAL HIGH (ref 4.0–10.5)

## 2015-12-06 LAB — TYPE AND SCREEN
ABO/RH(D): A POS
ANTIBODY SCREEN: NEGATIVE

## 2015-12-06 LAB — ABO/RH: ABO/RH(D): A POS

## 2015-12-06 LAB — RPR: RPR Ser Ql: NONREACTIVE

## 2015-12-06 MED ORDER — ACETAMINOPHEN 325 MG PO TABS
650.0000 mg | ORAL_TABLET | ORAL | Status: DC | PRN
  Administered 2015-12-07: 650 mg via ORAL
  Filled 2015-12-06: qty 2

## 2015-12-06 MED ORDER — LACTATED RINGERS IV SOLN
INTRAVENOUS | Status: DC
  Administered 2015-12-06 – 2015-12-07 (×4): via INTRAVENOUS

## 2015-12-06 MED ORDER — SOD CITRATE-CITRIC ACID 500-334 MG/5ML PO SOLN: 30.0000 mL | ORAL | Status: DC | PRN

## 2015-12-06 MED ORDER — PENICILLIN G POTASSIUM 5000000 UNITS IJ SOLR
5.0000 10*6.[IU] | Freq: Once | INTRAVENOUS | Status: DC
  Filled 2015-12-06: qty 5

## 2015-12-06 MED ORDER — MISOPROSTOL 25 MCG QUARTER TABLET
25.0000 ug | ORAL_TABLET | ORAL | Status: DC | PRN
  Administered 2015-12-06 (×3): 25 ug via VAGINAL
  Filled 2015-12-06: qty 1
  Filled 2015-12-06 (×3): qty 0.25

## 2015-12-06 MED ORDER — PENICILLIN G POTASSIUM 5000000 UNITS IJ SOLR
2.5000 10*6.[IU] | INTRAVENOUS | Status: DC
  Filled 2015-12-06 (×3): qty 2.5

## 2015-12-06 MED ORDER — OXYCODONE-ACETAMINOPHEN 5-325 MG PO TABS: 2.0000 | ORAL_TABLET | ORAL | Status: DC | PRN

## 2015-12-06 MED ORDER — LIDOCAINE HCL (PF) 1 % IJ SOLN
30.0000 mL | INTRAMUSCULAR | Status: DC | PRN
  Filled 2015-12-06: qty 30

## 2015-12-06 MED ORDER — PENICILLIN G POTASSIUM 5000000 UNITS IJ SOLR
2.5000 10*6.[IU] | INTRAVENOUS | Status: DC
  Administered 2015-12-06 – 2015-12-07 (×4): 2.5 10*6.[IU] via INTRAVENOUS
  Filled 2015-12-06 (×5): qty 2.5

## 2015-12-06 MED ORDER — BUTORPHANOL TARTRATE 1 MG/ML IJ SOLN
1.0000 mg | INTRAMUSCULAR | Status: DC | PRN
  Administered 2015-12-07: 1 mg via INTRAVENOUS
  Filled 2015-12-06: qty 1

## 2015-12-06 MED ORDER — OXYTOCIN BOLUS FROM INFUSION: 500.0000 mL | Freq: Once | INTRAVENOUS | Status: DC

## 2015-12-06 MED ORDER — OXYTOCIN 40 UNITS IN LACTATED RINGERS INFUSION - SIMPLE MED
1.0000 m[IU]/min | INTRAVENOUS | Status: DC
  Administered 2015-12-06: 2 m[IU]/min via INTRAVENOUS

## 2015-12-06 MED ORDER — OXYCODONE-ACETAMINOPHEN 5-325 MG PO TABS: 1.0000 | ORAL_TABLET | ORAL | Status: DC | PRN

## 2015-12-06 MED ORDER — ONDANSETRON HCL 4 MG/2ML IJ SOLN
4.0000 mg | Freq: Four times a day (QID) | INTRAMUSCULAR | Status: DC | PRN
  Administered 2015-12-07: 4 mg via INTRAVENOUS
  Filled 2015-12-06: qty 2

## 2015-12-06 MED ORDER — LACTATED RINGERS IV SOLN
500.0000 mL | INTRAVENOUS | Status: DC | PRN
Start: 2015-12-06 — End: 2015-12-08

## 2015-12-06 MED ORDER — PENICILLIN G POTASSIUM 5000000 UNITS IJ SOLR
5.0000 10*6.[IU] | Freq: Once | INTRAVENOUS | Status: AC
  Administered 2015-12-06: 5 10*6.[IU] via INTRAVENOUS
  Filled 2015-12-06: qty 5

## 2015-12-06 MED ORDER — OXYTOCIN 40 UNITS IN LACTATED RINGERS INFUSION - SIMPLE MED
2.5000 [IU]/h | INTRAVENOUS | Status: DC
  Filled 2015-12-06: qty 1000

## 2015-12-06 MED ORDER — TERBUTALINE SULFATE 1 MG/ML IJ SOLN
0.2500 mg | Freq: Once | INTRAMUSCULAR | Status: DC | PRN
  Filled 2015-12-06: qty 1

## 2015-12-06 NOTE — Progress Notes (Signed)
Feeling some ctx.  Foley came out Afeb, VSS FHT- cat I, ctx q 2-3 min VE-4/30/-2, vtx with polyp, AROM clear Continue pitocin and PCN, monitor progress

## 2015-12-06 NOTE — Progress Notes (Signed)
Resting comfortably Afeb, VSS FHT- Cat I, irreg ctx VE-2/20/-4, vtx, foley inserted into cervix Will start pitocin with foley bulb and monitor progress

## 2015-12-06 NOTE — Progress Notes (Signed)
Admitted last pm for ripening and induction.  Received 2 doses of cytotec, last dose at 0530. Not feeling anything Afeb, VSS FHT- 130-140, mod variability, + accels, no decels, Cat I, irreg ctx VE-1/20/-4, vtx, membranes stripped, cytotec placed Will see what 3rd dose of cytotec does, recheck in 4 hours and may try foley and pitocin

## 2015-12-06 NOTE — Anesthesia Pain Management Evaluation Note (Signed)
  CRNA Pain Management Visit Note  Patient: Brittany Mccarthy, 30 y.o., female  "Hello I am a member of the anesthesia team at [facility]. We have an anesthesia team available at all times to provide care throughout the hospital, including epidural management and anesthesia for C-section. I don't know your plan for the delivery whether it a natural birth, water birth, IV sedation, nitrous supplementation, doula or epidural, but we want to meet your pain goals."   1.Was your pain managed to your expectations on prior hospitalizations?   No prior hospitalizations  2.What is your expectation for pain management during this hospitalization?     Nitrous Oxide  3.How can we help you reach that goal? Be available if needed  Record the patient's initial score and the patient's pain goal.   Pain: 2  Pain Goal: 8 [facility] wants you to be able to say your pain was always managed very well.  Madison HickmanGREGORY,Jmichael Gille 12/06/2015

## 2015-12-07 ENCOUNTER — Inpatient Hospital Stay (HOSPITAL_COMMUNITY): Payer: 59 | Admitting: Anesthesiology

## 2015-12-07 LAB — CBC
HEMATOCRIT: 40.3 % (ref 36.0–46.0)
Hemoglobin: 14.3 g/dL (ref 12.0–15.0)
MCH: 33.3 pg (ref 26.0–34.0)
MCHC: 35.5 g/dL (ref 30.0–36.0)
MCV: 93.9 fL (ref 78.0–100.0)
Platelets: 148 10*3/uL — ABNORMAL LOW (ref 150–400)
RBC: 4.29 MIL/uL (ref 3.87–5.11)
RDW: 13.8 % (ref 11.5–15.5)
WBC: 17 10*3/uL — AB (ref 4.0–10.5)

## 2015-12-07 MED ORDER — DIPHENHYDRAMINE HCL 25 MG PO CAPS: 25.0000 mg | ORAL_CAPSULE | Freq: Four times a day (QID) | ORAL | Status: DC | PRN

## 2015-12-07 MED ORDER — IBUPROFEN 600 MG PO TABS
600.0000 mg | ORAL_TABLET | Freq: Four times a day (QID) | ORAL | Status: DC
  Administered 2015-12-07 – 2015-12-09 (×6): 600 mg via ORAL
  Filled 2015-12-07 (×6): qty 1

## 2015-12-07 MED ORDER — TETANUS-DIPHTH-ACELL PERTUSSIS 5-2.5-18.5 LF-MCG/0.5 IM SUSP
0.5000 mL | Freq: Once | INTRAMUSCULAR | Status: AC
  Administered 2015-12-09: 0.5 mL via INTRAMUSCULAR
  Filled 2015-12-07: qty 0.5

## 2015-12-07 MED ORDER — FENTANYL 2.5 MCG/ML BUPIVACAINE 1/10 % EPIDURAL INFUSION (WH - ANES)
14.0000 mL/h | INTRAMUSCULAR | Status: DC | PRN
  Administered 2015-12-07 (×2): 14 mL/h via EPIDURAL
  Filled 2015-12-07 (×3): qty 125

## 2015-12-07 MED ORDER — CEFAZOLIN IN D5W 1 GM/50ML IV SOLN
1.0000 g | Freq: Four times a day (QID) | INTRAVENOUS | Status: DC
  Administered 2015-12-07 (×2): 1 g via INTRAVENOUS
  Filled 2015-12-07 (×5): qty 50

## 2015-12-07 MED ORDER — LIDOCAINE HCL (PF) 1 % IJ SOLN
INTRAMUSCULAR | Status: DC | PRN
  Administered 2015-12-07: 4 mL via EPIDURAL
  Administered 2015-12-07: 2 mL via EPIDURAL
  Administered 2015-12-07: 4 mL via EPIDURAL

## 2015-12-07 MED ORDER — PHENYLEPHRINE 40 MCG/ML (10ML) SYRINGE FOR IV PUSH (FOR BLOOD PRESSURE SUPPORT)
80.0000 ug | PREFILLED_SYRINGE | INTRAVENOUS | Status: DC | PRN
  Filled 2015-12-07: qty 5

## 2015-12-07 MED ORDER — EPHEDRINE 5 MG/ML INJ
10.0000 mg | INTRAVENOUS | Status: DC | PRN
  Filled 2015-12-07: qty 4

## 2015-12-07 MED ORDER — OXYTOCIN 40 UNITS IN LACTATED RINGERS INFUSION - SIMPLE MED
1.0000 m[IU]/min | INTRAVENOUS | Status: DC
Start: 2015-12-07 — End: 2015-12-08

## 2015-12-07 MED ORDER — PRENATAL MULTIVITAMIN CH
1.0000 | ORAL_TABLET | Freq: Every day | ORAL | Status: DC
  Administered 2015-12-08 – 2015-12-09 (×2): 1 via ORAL
  Filled 2015-12-07 (×2): qty 1

## 2015-12-07 MED ORDER — SIMETHICONE 80 MG PO CHEW: 80.0000 mg | CHEWABLE_TABLET | ORAL | Status: DC | PRN

## 2015-12-07 MED ORDER — PHENYLEPHRINE 40 MCG/ML (10ML) SYRINGE FOR IV PUSH (FOR BLOOD PRESSURE SUPPORT)
80.0000 ug | PREFILLED_SYRINGE | INTRAVENOUS | Status: DC | PRN
  Filled 2015-12-07: qty 10
  Filled 2015-12-07: qty 5

## 2015-12-07 MED ORDER — WITCH HAZEL-GLYCERIN EX PADS: 1.0000 "application " | MEDICATED_PAD | CUTANEOUS | Status: DC | PRN

## 2015-12-07 MED ORDER — ZOLPIDEM TARTRATE 5 MG PO TABS: 5.0000 mg | ORAL_TABLET | Freq: Every evening | ORAL | Status: DC | PRN

## 2015-12-07 MED ORDER — DIPHENHYDRAMINE HCL 50 MG/ML IJ SOLN: 12.5000 mg | INTRAMUSCULAR | Status: DC | PRN

## 2015-12-07 MED ORDER — BENZOCAINE-MENTHOL 20-0.5 % EX AERO
1.0000 "application " | INHALATION_SPRAY | CUTANEOUS | Status: DC | PRN
  Administered 2015-12-07: 1 via TOPICAL
  Filled 2015-12-07: qty 56

## 2015-12-07 MED ORDER — ONDANSETRON HCL 4 MG PO TABS: 4.0000 mg | ORAL_TABLET | ORAL | Status: DC | PRN

## 2015-12-07 MED ORDER — ONDANSETRON HCL 4 MG/2ML IJ SOLN: 4.0000 mg | INTRAMUSCULAR | Status: DC | PRN

## 2015-12-07 MED ORDER — DIBUCAINE 1 % RE OINT: 1.0000 "application " | TOPICAL_OINTMENT | RECTAL | Status: DC | PRN

## 2015-12-07 MED ORDER — SENNOSIDES-DOCUSATE SODIUM 8.6-50 MG PO TABS
2.0000 | ORAL_TABLET | ORAL | Status: DC
  Administered 2015-12-07 – 2015-12-09 (×2): 2 via ORAL
  Filled 2015-12-07 (×2): qty 2

## 2015-12-07 MED ORDER — LACTATED RINGERS IV SOLN: 500.0000 mL | Freq: Once | INTRAVENOUS | Status: DC

## 2015-12-07 MED ORDER — ACETAMINOPHEN 325 MG PO TABS
650.0000 mg | ORAL_TABLET | ORAL | Status: DC | PRN
  Administered 2015-12-09: 650 mg via ORAL
  Filled 2015-12-07: qty 2

## 2015-12-07 MED ORDER — COCONUT OIL OIL
1.0000 "application " | TOPICAL_OIL | Status: DC | PRN
  Administered 2015-12-07: 1 via TOPICAL
  Filled 2015-12-07: qty 120

## 2015-12-07 NOTE — Progress Notes (Signed)
Patient ID: Brittany Mccarthy, female   DOB: [birth year], 30 y.o.   MRN: [id number] Pt with no complaints. +Fms VSS EFM - cat 1 TOCO - contractions q 2-575mins SVE - complete/100/0  A/P: Pushed for 45mins and now rested x 1hr; will resume pushing now         Plan for svd

## 2015-12-07 NOTE — Progress Notes (Signed)
Patient ID: Brittany Mccarthy, female   DOB: 1985-05-05, 30 y.o.   MRN: [id number] Pt comfortable with epidural. No complaints. +Fms VSS TOCO- contractions q 5mins EFM- cat 1 SVE 9/90/-2 Pit at 10mus  A/P: Continue expectant mgmt         Anticipate svd

## 2015-12-07 NOTE — Progress Notes (Signed)
Brittany Mccarthy is a 30 y.o. G1P0 at 4054w0d admitted for induction of labor due to Post dates.   Subjective: Pt reports pain controlled with epidural. No fever or chills or headaches. +fms  Objective: BP 117/75   Pulse (!) 104   Temp 97.9 F (36.6 C) (Axillary)   Resp 18   Ht 5\' 9"  (1.753 m)   Wt 220 lb (99.8 kg)   LMP 02/23/2015   SpO2 96%   BMI 32.49 kg/m  No intake/output data recorded. No intake/output data recorded.  FHT:  FHR: 145 bpm, variability: moderate,  accelerations:  Present,  decelerations:  Present late UC:   regular, every 5 minutes SVE:   Dilation: 7.5 Effacement (%): 70 Station: -1 Exam by:: Dr. Mindi SlickerBanga  Labs: Lab Results  Component Value Date   WBC 17.0 (H) 12/07/2015   HGB 14.3 12/07/2015   HCT 40.3 12/07/2015   MCV 93.9 12/07/2015   PLT 148 (L) 12/07/2015    Assessment / Plan: Progressing on pitocin however due to late decels will stop pitocin and give chance to recover x 30mins. Will restart pitocin at 6mus if able. Will stop PCN as has received 5doses; will start on ancef for prolonged AROM at 1130am  Labor: see above Preeclampsia:  n/a Fetal Wellbeing:  Category II Pain Control:  Epidural I/D:  n/a Anticipated MOD:  NSVD  Brittany Givenecilia Worema Mccarthy 12/07/2015, 9:41 AM

## 2015-12-07 NOTE — Anesthesia Preprocedure Evaluation (Signed)
Anesthesia Evaluation  Patient identified by MRN, date of birth, ID band Patient awake    Reviewed: Allergy & Precautions, H&P , NPO status , Patient's Chart, lab work & pertinent test results  History of Anesthesia Complications Negative for: history of anesthetic complications  Airway Mallampati: II  TM Distance: >3 FB Neck ROM: full    Dental no notable dental hx.    Pulmonary neg pulmonary ROS,    Pulmonary exam normal breath sounds clear to auscultation       Cardiovascular negative cardio ROS Normal cardiovascular exam Rhythm:regular Rate:Normal     Neuro/Psych negative neurological ROS     GI/Hepatic negative GI ROS, Neg liver ROS,   Endo/Other  negative endocrine ROS  Renal/GU negative Renal ROS     Musculoskeletal  (+) Arthritis ,   Abdominal   Peds  Hematology negative hematology ROS (+)   Anesthesia Other Findings Pregnancy - reports she has scoliosis, no back surgery, IOL for a while now Platelets and allergies reviewed Denies active cardiac or pulmonary symptoms, METS > 4  Denies blood thinning medications, bleeding disorders, hypertension, asthma, supine hypotension syndrome, previous anesthesia difficulties   Reproductive/Obstetrics (+) Pregnancy                             Anesthesia Physical Anesthesia Plan  ASA: III  Anesthesia Plan: Epidural   Post-op Pain Management:    Induction:   Airway Management Planned:   Additional Equipment:   Intra-op Plan:   Post-operative Plan:   Informed Consent: I have reviewed the patients History and Physical, chart, labs and discussed the procedure including the risks, benefits and alternatives for the proposed anesthesia with the patient or authorized representative who has indicated his/her understanding and acceptance.   Dental Advisory Given  Plan Discussed with: Anesthesiologist, CRNA and Surgeon  Anesthesia  Plan Comments:         Anesthesia Quick Evaluation

## 2015-12-07 NOTE — Progress Notes (Signed)
Feeling ctx Afeb, VSS FHT- 140s, mod variability, + accels, early decels, Cat I, ctx q 3-5 min VE-6-7/50/-2, vtx, polyp present, unable to insert IUPC Discussed prolonged labor, may be just getting into active labor.  Will continue pitocin and PCN, monitor progress

## 2015-12-07 NOTE — Anesthesia Procedure Notes (Signed)
Epidural Patient location during procedure: OB  Staffing Anesthesiologist: Fredrica Capano Performed: anesthesiologist   Preanesthetic Checklist Completed: patient identified, site marked, surgical consent, pre-op evaluation, timeout performed, IV checked, risks and benefits discussed and monitors and equipment checked  Epidural Patient position: sitting Prep: site prepped and draped and DuraPrep Patient monitoring: continuous pulse ox and blood pressure Approach: midline Location: L4-L5 Injection technique: LOR saline  Needle:  Needle type: Tuohy  Needle gauge: 17 G Needle length: 9 cm and 9 Needle insertion depth: 6 cm Catheter type: closed end flexible Catheter size: 19 Gauge Catheter at skin depth: 10 cm Test dose: negative  Assessment Events: blood not aspirated, injection not painful, no injection resistance, negative IV test and no paresthesia  Additional Notes Patient identified. Risks/Benefits/Options discussed with patient including but not limited to bleeding, infection, nerve damage, paralysis, failed block, incomplete pain control, headache, blood pressure changes, nausea, vomiting, reactions to medications, itching and postpartum back pain. Confirmed with bedside nurse the patient's most recent platelet count. Confirmed with patient that they are not currently taking any anticoagulation, have any bleeding history or any family history of bleeding disorders. Patient expressed understanding and wished to proceed. All questions were answered. Sterile technique was used throughout the entire procedure. Please see nursing notes for vital signs. Test dose was given through epidural catheter and negative prior to continuing to dose epidural or start infusion. Warning signs of high block given to the patient including shortness of breath, tingling/numbness in hands, complete motor block, or any concerning symptoms with instructions to call for help. Patient was given instructions  on fall risk and not to get out of bed. All questions and concerns addressed with instructions to call with any issues or inadequate analgesia.  Reason for block:at surgeon's request and procedure for pain

## 2015-12-08 ENCOUNTER — Encounter (HOSPITAL_COMMUNITY): Payer: Self-pay

## 2015-12-08 LAB — CBC
HEMATOCRIT: 36 % (ref 36.0–46.0)
HEMOGLOBIN: 12.8 g/dL (ref 12.0–15.0)
MCH: 33.4 pg (ref 26.0–34.0)
MCHC: 35.6 g/dL (ref 30.0–36.0)
MCV: 94 fL (ref 78.0–100.0)
Platelets: 145 10*3/uL — ABNORMAL LOW (ref 150–400)
RBC: 3.83 MIL/uL — AB (ref 3.87–5.11)
RDW: 14.1 % (ref 11.5–15.5)
WBC: 19.9 10*3/uL — AB (ref 4.0–10.5)

## 2015-12-08 NOTE — Progress Notes (Signed)
Patient ID: Brittany Mccarthy, female   DOB: [birth year], 30 y.o.   MRN: [id number] Pt doing well -sore and tired as expected. No fever or chills or headaches. Bonding well with baby. Lochia mild. No complaints VSS ABD- FF EXT - no Homans  A/P: PPD#1 s/p svd - stable         Routine pp care         Circ later tonight or in am ( when baby >24hrs old)         Likely discharge to home tomorrow

## 2015-12-08 NOTE — Progress Notes (Signed)
Patient assisted out of bed with use of the stedy to bathroom to void. Still unable to bear complete weight on left leg. Voided large amount of urine in toilet. Instructed in peri care then back to bed using stedy chair. Patient tolerated well with no dizziness. Condition stable.

## 2015-12-08 NOTE — Anesthesia Postprocedure Evaluation (Signed)
Anesthesia Post Note  Patient: Brittany Mccarthy  Procedure(s) Performed: * No procedures listed *  Patient location during evaluation: Mother Baby Anesthesia Type: Epidural Level of consciousness: awake and alert, oriented and patient cooperative Pain management: pain level controlled Vital Signs Assessment: post-procedure vital signs reviewed and stable Respiratory status: spontaneous breathing Cardiovascular status: stable Postop Assessment: no headache, epidural receding, patient able to bend at knees and no signs of nausea or vomiting Anesthetic complications: no Comments: Pain score 0.     Last Vitals:  Vitals:   12/08/15 0005 12/08/15 0159  BP: 110/65 124/66  Pulse: 76 85  Resp: 20 18  Temp: 36.3 C 36.3 C    Last Pain:  Vitals:   12/08/15 0820  TempSrc:   PainSc: (P) 0-No pain   Pain Goal: Patients Stated Pain Goal: (P) 3 (12/08/15 0820)               Merrilyn PumaWRINKLE,Brittany Mccarthy

## 2015-12-08 NOTE — Progress Notes (Signed)
[facility] accompanied by nurse and patient's husband. Assisted into bed with the use of the stedy chair due to patient having numbness in left leg. Vitals WNL, Color is pink, LCTA bilat, fundus is firm and at the umbilicus with small rubra lochia. Perineal area is edematous and bruise. Oriented to room, call light, visiting hours and paper work. Pain scale is zero at this time. Condition stable.

## 2015-12-08 NOTE — Lactation Note (Signed)
This note was copied from a baby's chart. Lactation Consultation Note  Patient Name: Brittany Mccarthy Today's Date: 12/08/2015 Reason for consult: Initial assessment RN initiated 20 nipple shield due to Mom's flat nipples bilateral and inability to latch without the nipple shield. LC assisted Mom with positioning with nipple shield, baby sleepy but demonstrated few good suckling bursts. Basic teaching reviewed with Mom. Advised to continue to BF with feeding ques, 8-12 times or more in 24 hours. Keep baby nursing for 15-20 minutes both breasts when able. Cluster feeding discussed. Lactation brochure left for review, advised of OP services and support group. RN to set up DEBP for MOm to start post pumping after feedings to encourage milk production. Mom to call for assist as needed.   Maternal Data Has patient been taught Hand Expression?: Yes Does the patient have breastfeeding experience prior to this delivery?: No  Feeding Feeding Type: Breast Fed Length of feed: 25 min  LATCH Score/Interventions Latch: Grasps breast easily, tongue down, lips flanged, rhythmical sucking. (usng 20 nipple shield) Intervention(s): Adjust position;Assist with latch;Breast massage;Breast compression  Audible Swallowing: A few with stimulation  Type of Nipple: Flat Intervention(s): Hand pump  Comfort (Breast/Nipple): Soft / non-tender     Hold (Positioning): Assistance needed to correctly position infant at breast and maintain latch. Intervention(s): Breastfeeding basics reviewed;Support Pillows;Position options;Skin to skin  LATCH Score: 7  Lactation Tools Discussed/Used Tools: Nipple Dorris CarnesShields;Pump Nipple shield size: 20 Breast pump type: Manual WIC Program: No   Consult Status Consult Status: Follow-up Date: 12/09/15 Follow-up type: In-patient    Alfred LevinsGranger, Syriana Croslin Ann 12/08/2015, 5:41 PM

## 2015-12-09 MED ORDER — IBUPROFEN 600 MG PO TABS: 600.0000 mg | ORAL_TABLET | Freq: Four times a day (QID) | ORAL | 1 refills | Status: DC | PRN

## 2015-12-09 NOTE — Lactation Note (Signed)
This note was copied from a baby's chart. Lactation Consultation Note  Patient Name: Boy Dayona Chretien Today's Date: 12/09/2015 Reason for consult: Follow-up assessment;Difficult latch  Baby having circumcision in nursery at present. Mom reports baby latching with nipple shield, she does report observing colostrum in nipple shield but also reports some discomfort in her breast with feedings. Baby has been to breast 6 times in 24 hours for 10-45 minutes, 3 voids/7 stools in life, 2 emesis. Advised Mom baby should be at breast 8-12 times in 24 hours and with feeding ques, nursing for 15-30 minutes both breasts some feedings. Encouraged Mom to post pump 4-6 times per day, not at night to encourage milk production since using nipple shield. Engorgement care reviewed if needed. Advised to refer to Baby N Me booklet, page 25 for breast milk storage guidelines but also advised Mom she could give baby back EBM she receives with pumping. OP f/u scheduled with lactation for Wednesday, 12/12/15 at 0900. LC left phone number for Mom to call to observe feeding before d/c home.  Maternal Data    Feeding Feeding Type: Breast Fed Length of feed: 30 min  LATCH Score/Interventions Latch: Grasps breast easily, tongue down, lips flanged, rhythmical sucking.  Audible Swallowing: Spontaneous and intermittent  Type of Nipple: Flat Intervention(s):  (nipple shield)  Comfort (Breast/Nipple): Soft / non-tender     Hold (Positioning): No assistance needed to correctly position infant at breast.  LATCH Score: 9  Lactation Tools Discussed/Used Tools: Nipple Dorris CarnesShields;Pump Nipple shield size: 20 Breast pump type: Double-Electric Breast Pump   Consult Status Consult Status: Follow-up Date: 12/09/15 Follow-up type: In-patient    Alfred LevinsGranger, Fantashia Shupert Ann 12/09/2015, 9:28 AM

## 2015-12-09 NOTE — Progress Notes (Signed)
Pt c/o numbness and tingling to left leg below knee, unable to flex foot to full ROM, +1 edema noted to both legs, homan's sign negative bilaterally. MD and anesthesiolgist aware.

## 2015-12-09 NOTE — Discharge Instructions (Signed)
Nothing in vagina for 6 weeks.  No sex, tampons, and douching.  Other instructions as in [facility] Discharge Booklet. °

## 2015-12-09 NOTE — Progress Notes (Addendum)
Patient requested that her left leg be reassessed by anesthesiology before she is discharged. States that last evening (9/23) was the first time since delivery on 9/22 at 2041 that she has been able to walk unassisted to bathroom; however, lower left leg from knee down still "tingles."  Patient also noted limited flexion and pain on flexion. Dr Gentry RochJudd of anesthesiology notified. Dr Mindi SlickerBanga also notified and will assess in a.m.Marland Kitchen. jt Pixie Burgener, rn

## 2015-12-09 NOTE — Progress Notes (Signed)
I saw Brittany Mccarthy today at 8am to evaluate her left foot extension weakness. Her weakness is definitely asymmetric with good strength on right and with mostly inability to extend her left foot. She denies red flag concerning symptoms like back pain, bilateral symptoms, loss of bladder or bowel function or an elevated WBC and fever. She has stable vital signs. I explained this could be related to multiple things but is not totally uncommon in pregancy with vaginal delivery while in leg stirrups. Most likely a peroneal nerve injury/sciatic nerve injury. Of note both her legs are markedly swollen which should improve. She will be sure to walk carefully at home to avoid falls, she is ambulating here in the hospital. This injury should definitley get better in the next days to weeks/months and is extremely rare to persist after that. She will follow up with her OB team if it persists. Her and her husband have no further questions or concerns. Thanks so much for letting us participate in Merlie's care.   Mable FillB Kyeshia Zinn, MD

## 2015-12-09 NOTE — Progress Notes (Signed)
Patient ID: Brittany Mccarthy, female   DOB: [birth year], 30 y.o.   MRN: [id number] Pt reports tingling sensation in left knee; difficulty dorsiflexing left foot. Anesthesia assessed. No fever or chilsl or bladder/bowel issues. Pain controlled with ibuprofen. Lochia mild. No headaches or blurry vision. Bonding well with baby; breastfeeding VSS ABD- soft, ND, FF EXT-limited dorsiflexion in left foot, mild b/l edema, no Homans, equal calves; no tenderness  A/P: PPD#2 s/p svd - stable         Likely peroneal irritation in left leg - reviewed muscle energy techniques to try at home         F/u in office in 6weeks for pp visit         Instructions reviewed         Circ done

## 2015-12-09 NOTE — Discharge Summary (Signed)
OB Discharge Summary     Patient Name: Brittany Mccarthy DOB: February 05, 1986 MRN: [id number]  Date of admission: 12/06/2015 Delivering MD: [facility]   Date of discharge: 12/09/2015  Admitting diagnosis: INDUCTION Intrauterine pregnancy: [redacted]w[redacted]d     Secondary diagnosis:  Active Problems:   Normal pregnancy in third trimester   SVD (spontaneous vaginal delivery)   Postpartum care following vaginal delivery  Additional problems: left foot injury     Discharge diagnosis: Term Pregnancy Delivered                                                                                                Post partum procedures:none  Augmentation: AROM, Pitocin, Cytotec and Foley Balloon  Complications:Mild dystocia  Hospital course:  Induction of Labor With Vaginal Delivery   30 y.o. yo G1P0 at [redacted]w[redacted]d was admitted to the hospital 12/06/2015 for induction of labor.  Indication for induction: Postdates.  Patient had an uncomplicated labor course as follows: Membrane Rupture Time/Date: 5:20 PM ,12/06/2015   Intrapartum Procedures: Episiotomy: None [1]                                         Lacerations:  2nd degree [3];Vaginal [6];Perineal [11]  Patient had delivery of a Viable infant.  Information for the patient's newborn:  Genowefa, Morga Topanga [[id number]]  Delivery Method: Vag-Spont   12/07/2015  Details of delivery can be found in separate delivery note.  Patient had a routine postpartum course. Patient is discharged home 12/09/15.   Physical exam Vitals:   12/08/15 0159 12/08/15 1500 12/08/15 1725 12/09/15 0550  BP: 124/66 111/75 101/66 (!) 105/57  Pulse: 85 (!) 101 80 78  Resp: 18 20 20 18   Temp: 97.4 F (36.3 C) 97.8 F (36.6 C) 97.7 F (36.5 C) 97.9 F (36.6 C)  TempSrc: Oral Oral Oral   SpO2: 100%     Weight:      Height:       General: alert, cooperative and no distress Lochia: appropriate Uterine Fundus: firm Incision: N/A DVT Evaluation: No evidence of DVT seen on physical  exam. Negative Homan's sign. Left foot dorsiflexion problem Labs: Lab Results  Component Value Date   WBC 19.9 (H) 12/08/2015   HGB 12.8 12/08/2015   HCT 36.0 12/08/2015   MCV 94.0 12/08/2015   PLT 145 (L) 12/08/2015   CMP Latest Ref Rng & Units 06/16/2014  Glucose 70 - 99 mg/dL 94  BUN 6 - 23 mg/dL 12  Creatinine 7.82 - 9.56 mg/dL 2.13  Sodium 086 - 578 mEq/L 138  Potassium 3.5 - 5.1 mEq/L 4.4  Chloride 96 - 112 mEq/L 106  CO2 19 - 32 mEq/L 28  Calcium 8.4 - 10.5 mg/dL 9.4  Total Protein 6.0 - 8.3 g/dL 6.7  Total Bilirubin 0.2 - 1.2 mg/dL 0.3  Alkaline Phos 39 - 117 U/L 57  AST 0 - 37 U/L 18  ALT 0 - 35 U/L 13    Discharge instruction: per After Visit Summary  and "Baby and Me Booklet".  After visit meds:    Medication List    TAKE these medications   diphenhydrAMINE 25 MG tablet Commonly known as:  BENADRYL Take 50 mg by mouth every 6 (six) hours as needed for itching.   ibuprofen 600 MG tablet Commonly known as:  ADVIL,MOTRIN Take 1 tablet (600 mg total) by mouth every 6 (six) hours as needed.   prenatal multivitamin Tabs tablet Take 1 tablet by mouth at bedtime.   ranitidine 150 MG tablet Commonly known as:  ZANTAC Take 150 mg by mouth daily.       Diet: routine diet  Activity: Advance as tolerated. Pelvic rest for 6 weeks.   Outpatient follow up:6 weeks Follow up Appt:Future Appointments Date Time Provider Department Center  12/12/2015 9:00 AM [facility] LAC CONSULTANT [facility] None   Follow up Visit:No Follow-up on file.  Postpartum contraception: Undecided  Newborn Data: Live born female  Birth Weight: 9 lb 8.9 oz (4335 g) APGAR: 4, 9  Baby Feeding: Breast Disposition:home with mother   12/09/2015 Sharol Givenecilia Worema Naftula Donahue, DO

## 2015-12-12 ENCOUNTER — Ambulatory Visit (HOSPITAL_COMMUNITY)
Admit: 2015-12-12 | Discharge: 2015-12-12 | Disposition: A | Discharge status: Home / Self Care | Payer: 59 | Attending: Obstetrics and Gynecology | Admitting: Obstetrics and Gynecology

## 2015-12-12 NOTE — Lactation Note (Addendum)
Lactation Consult - Brittany Mccarthy , mom Brittany Mccarthy , grandmother present at 9 am consult .  Per mom had a Dr. Visit with Dr. Dario Guardian yesterday, and due to weight loss - supplementing  after 10 mins BF each breast , supplement with 60 ml ( may not take 60 ml ) until milk comes in.  Prior to yesterday baby was latching every 3-4 hours  on the breast 30-40 mins with NS  and spending a lot of time not feeding with  The nipple in his mouth. Also using a #20 NS since the hospital. Nipples got sore after going home, and breast are very full  ,and uncomfortable. Haven't pumped since last night at 6:30 pm.  Baby has increased to 5-6 wets , 4-5 black to green stools.  Supplementing with Similiac ( easy digest ) provided from Dr. Terrence Dupont office. Taking about 30 ml after feeding , sometimes 60 ml. And very little EBM.  Per mom has been exhausted and hasn't got'en much sleep since last week. Last night was better.   Pleas see below for the consult report   Mother's reason for visit: F/U from the hospital and use of NS , problems with latching  Visit Type:  Feeding assessment  Appointment Notes:  Using a NS , appt. Confirmed for 9/27  Consult:  Initial Lactation Consultant:  Brittany Mccarthy  ________________________________________________________________________ Baby's Name:  Brittany Mccarthy Date of Birth:  12/07/2016 Pediatrician:  Dr. Randell Loop  Gender:  female Gestational Age: [redacted]w[redacted]d (At Birth) Birth Weight:  9 lb 8.9 oz (4335 g) Weight at Discharge:  Weight: 9 lb 1.2 oz (4115 g)               Date of Discharge:  12/09/2015      [facility] Weights   12/07/15 2041 12/08/15 0000 12/09/15 0005  Weight: 9 lb 8.9 oz (4335 g) 9 lb 9.3 oz (4345 g) 9 lb 1.2 oz (4115 g)  [facility]  8-5 [facility]     Location:Pediatrician's office 9/26 Tuesday  Weight today:  3924 g , 8-10.4 oz     ________________________________________________________________________  Mother's  Name: Brittany Mccarthy Type of delivery:  Vaginal - long labor and post dates  Breastfeeding Experience:  1st baby  Maternal Medical Conditions:  No risk , Except mom has a lot of generalized edema esp feet and ankles  Maternal Medications:  PNV , Motrin   _____________________________________________________________  ________________________________________________________________________  Maternal Breast Assessment  Breast:  Full and Engorged Nipple:  Flat Pain level:  2 Pain interventions:  Expressed breast milk  _______________________________________________________________________ Feeding Assessment/Evaluation  Initial feeding assessment:  Infant's oral assessment:  High palate , upper lip stretches well with exam and at the latch / breast and bottle. When Brittany Mccarthy was sucking on a gloved finger noted intermittent humping of the back of his tongue and he didn't  Extend tongue over gum line much.   Do to boarder line engorgement to start and completely non compressible areolas and flat nipples  Had mom mom 1st for 10 mins to make the nipple and areola more compressible so the NS would fit.  Mom brought a DEBP Spectra pump , had to increase flange to #28 , and accommodated the base of the nipple to  Areola , and per mom comfortable. LC intermittently massage breast issue and compressions with moms permission.  After 10 mins able to to apply #24 NS , instill EBM into the  top for an appetizer.  Baby presented very hungry , so LC had grandma feed 20 ml of formula until breast were ready for latch.   Positioning:  Football Right breast  LATCH documentation:  Latch:  2 = Grasps breast easily, tongue down, lips flanged, rhythmical sucking.  Audible swallowing:  2 = Spontaneous and intermittent  Type of nipple:  2 = Everted at rest and after stimulation ( with use #24 NS )   Comfort (Breast/Nipple):  1 = Filling, red/small blisters or bruises, mild/mod discomfort  Hold  (Positioning):  1 = Assistance needed to correctly position infant at breast and maintain latch  LATCH score:  8   Attached assessment:  Shallow @ 1st   Lips flanged:  No.- flipped upper lip to flanged position   Lips untucked:  Yes.    Suck assessment:  Nutritive  Tools:  Nipple shield 24 mm Instructed on use and cleaning of tool:  Yes.    Pre-feed weight:  3924 g , 8-10.4 oz  Post-feed weight:  3956 g , 8-11.5 oz  Amount transferred:32 ml  Amount supplemented:  3 ml from the EBM appetizer in the NS ) , and 20 ml appetizer from a bottle.  Amount actually transferred from the breast 8 ml   Additional Feeding Assessment -   Infant's oral assessment:  See note above for oral assessment   Positioning:  Football Right breast  LATCH documentation:  Latch:  2 = Grasps breast easily, tongue down, lips flanged, rhythmical sucking.  Audible swallowing:  2 = Spontaneous and intermittent  Type of nipple:  2 = Everted at rest and after stimulation ( NS #24 - good fit )   Comfort (Breast/Nipple):  1 = Filling, red/small blisters or bruises, mild/mod discomfort  Hold (Positioning):  1 = Assistance needed to correctly position infant at breast and maintain latch  LATCH score: 8   Attached assessment:  Deep  Lips flanged:  Yes.    Lips untucked:  Yes.    Suck assessment:  Nutritive  Tools:  Nipple shield 24 mm Instructed on use and cleaning of tool:  Yes.    Pre-feed weight: 3956 g , 8-11.5 oz  Post-feed weight: 3970 g , 8-12.0 oz  Amount transferred:  14 ml  Amount supplemented: 3 ml of EBM ( instilled in the top appetizer )  Amount actually  transferred off the breast = 11 ml   Total amount pumped post feed:  R 44  ml    L 60  ml  Total amount transferred: 19 ml ( off one breast ) / breast feeding  Total supplement given:  27 ml  Total for feeding : 46 ml   Lactation Impression:  Baby gained 5 oz since yesterday  Color pink , no jaundice , noted dry lips, alert and hungry   Latched well with #24 NS after engorgement improved , ( sluggish at times , needing stimulation)  Engorgement issues resolving by the end of consult , mom aware the areas of the breast that still need work. Also aware to protect her establishing milk supply by feeding the baby and pumping  Also importance of getting the baby gaining steadily with supplementing  Moms milk is in and post pumped off 104 ml  Praised mom for her efforts , and grandmother for her support.  See [facility] Plan below   Lactation Plan of Care:  Per mom smart start weight this Friday 9/29  Per mom DR. Pudlo office visit Monday  9 am  F/u for LC visit Monday 10 10/2 at 1:30 p  Breast feeding goal to protect establishing milk supply  And to increase Baby [name]'s weight steadily  Mom - rest , naps ,  Plenty of fluids esp water , nutritious snacks and meals  Sore nipple and engorgement prevention and tx  EBM to nipples liberally , Cold comfort gels alternating with breast shells Baby [name] - feed with feeding cues , offer breast Days 2 1/2 - 3 hours, and evenings, nights by 3 hours  Goal - at least 8 feedings a day  Growth spurts 7-10 days , 3 weeks , 6 weeks , cluster feedings are normal  Option #1 - breast feed with #24 NS , instill EBM in the top , feed 15 -20 mins max and supplement with EBM 45 ml or greater  Pump the other breast 15 -20 mins  Option #2 - If Alan MulderLiam is sluggish acting like it;s not time to feed and it's 3 hours - may need to supplement 1/2 of supplement and then breast If still acting same , finish feeding with entire supplement and pump for 15 -20 mins  Option #3 - [name] receives a bottle 2 oz ( pace feeding ) and pump both breast 15 -20 mins .  Option 3  To be used if mom is exhausted due to excessive edema and decreased let down.  Broad based nipple Nuk , - challenge [name] to open wide and make sure lips are flanged - pace feed

## 2015-12-17 ENCOUNTER — Ambulatory Visit (HOSPITAL_COMMUNITY)
Admission: RE | Admit: 2015-12-17 | Discharge: 2015-12-17 | Disposition: A | Discharge status: Home / Self Care | Payer: 59 | Source: Ambulatory Visit | Attending: Obstetrics and Gynecology | Admitting: Obstetrics and Gynecology

## 2015-12-17 NOTE — Lactation Note (Signed)
Lactation Consult  Mother's reason for visit:  F/U from last week LC O/P appt.  Visit Type: Feeding assessment  Appointment Notes: F/u , 24 NS , DEBP , low weight gain  Consult:  Follow-Up Lactation Consultant:  Brittany Mccarthy  ________________________________________________________________________ Brittany FloresBaby's Name:  Brittany Mccarthy Date of Birth:  12/07/2016 Pediatrician:  Dr. Rosanne Ashingonald Mccarthy  Gender:  female Gestational Age: 3031w0d (At Birth) Birth Weight:  9 lb 8.9 oz (4335 g) Weight at Discharge:  Weight: 9 lb 1.2 oz (4115 g)               Date of Discharge:  12/09/2015      [facility]   12/[facility]15 2041 12/08/15 0000 12/09/15 0005  Weight: 9 lb 8.9 oz (4335 g) 9 lb 9.3 oz (4345 g) 9 lb 1.2 oz (4115 g)  [facility]  9-3 oz 9/29     Location:[facility] Weight today:  9.2.3 oz 4148 oz     ________________________________________________________________________  Mother's Name: Brittany Mccarthy Type of delivery:  Vaginal Delivery  Breastfeeding Experience:  1st baby  Maternal Medical Conditions:  Excessive edema , mild PIH with no tx  Maternal Medications:  PNV   ________________________________________________________________________  Breastfeeding History (Post Discharge) - this LC appt. Today is a F/U from last Wednesday.  Mom swelling has improved, still some in feet . Per mom no engorgement since appt. Last Wednesday.  Also [facility] and the options have been working well . Brittany MulderLiam is hanging out less compared to last  Week. The right breast produces more than the left when post pump ( per mom pumping every 3 - 3 1/2 hours after feedings)  Feed 1st breast 15 -20 mins , and sometimes offers the 2nd breast , if not post pump for 15 -20 mins with 2 oz EBM yield. DEBP Spectra ( larger flange ). Sore nipples have completely cleared.  Per mom when Brittany Mccarthy feeds only breast - supplementing  2 oz afterwards. Also have tried  latching without the Nipple Shield  Without success.  See LC impression and LC plan below.   Frequency of breastfeeding:  Every 3 - 3 - 1/2 hours  Duration of feeding:  15 -20 mins     Infant Intake and Output Assessment  Voids: 6-7  in 24 hrs.  Color:  Clear yellow Stools: 5-6  in 24 hrs.  Color:  Yellow  ________________________________________________________________________  Maternal Breast Assessment  Breast:  Filling Nipple:  Erect Pain level:  0 Pain interventions:  Expressed breast milk  _______________________________________________________________________ Feeding Assessment/Evaluation  Initial feeding assessment:  Infant's oral assessment:  High palate , short labial above the gum line , upper lip stretches well with exam and at latch with NS  Good lateral tongue movement. LC suspects questionable a short posterior frenulum LC noted more depth with the #20 NS compared to the #24, also baby Brittany MulderLiam able to keep upper lip flanged position better.  Last week consult the #24 Flange was a better due to all mom edema , but since mom swelling has decreased so much since last Wednesday. The #20 NS now is a better fit.   Positioning:  Cross cradle Left breast  LATCH documentation:  Latch:  2 = Grasps breast easily, tongue down, lips flanged, rhythmical sucking.  Audible swallowing:  2 = Spontaneous and intermittent  Type of nipple:  2 = Everted at rest and after stimulation  Comfort (Breast/Nipple):  1 = Filling, red/small blisters or bruises, mild/mod discomfort  Hold (Positioning):  1 = Assistance needed to correctly position infant at breast and maintain latch  LATCH score:  8   Attached assessment:  Shallow @ 1st   Lips flanged:  No.  Lips untucked:  Yes.    Suck assessment:  Nutritive and Nonnutritive  Tools:  Nipple shield 24 mm Instructed on use and cleaning of tool:  Yes.    Pre-feed weight: 41246 g , 9.9.2 oz  Post-feed weight:  4148 g , 9.2.3 oz   Amount transferred: 2 ml  Amount supplemented:  None - re-latched  Additional feeding same breast : wet diaper changed and re-weight  Pre - feeding weight : 4142 g , 9.2.1 oz  Post - feed weight : 4146 g , 9.2.3 oz  Amount transferred: 4 ml   Additional Feeding Assessment -   Infant's oral assessment:  See above note   Positioning:  Football Right breast  LATCH documentation:  Latch:  2 = Grasps breast easily, tongue down, lips flanged, rhythmical sucking.  Audible swallowing:  2 = Spontaneous and intermittent  Type of nipple:  2 = Everted at rest and after stimulation  Comfort (Breast/Nipple):  1 = Filling, red/small blisters or bruises, mild/mod discomfort  Hold (Positioning):  1 = Assistance needed to correctly position infant at breast and maintain latch  LATCH score:  8   Attached assessment:  Deep  Lips flanged:  No. ( LC flipped upper lip to flange position   Lips untucked:  Yes.    Suck assessment:  Nutritive  Tools:  Nipple shield 20 mm Instructed on use and cleaning of tool:  Yes.    Wet diaper changed 2nd time - re-weight   Pre-feed weight: 4116 g , 9.1.2 oz  Post-feed weight: 4188 g , 9.3.7 oz  Amount transferred:  72 ml   After 15 mins abby still acting hungry - grandma supplemented with EBM  Amount supplemented: 28 ml of EBM   Total amount pumped post feed:  60 ml ( more off the right compared to the left )   Total amount transferred:  78 ml  Total supplement given:  28 ml  Total amount for feeding with a 15 min break - 106 ml ( no spitting )   Lactation Impression:  Brittany Mccarthy is 51 days old and 6 oz from birth weight  Lost one oz from last Friday's weight with [facility] and no gain  Left breast used #24 NS - only 6 ml transfer  Right breast switched to lower NS #20 , and transferred 72 ml ( impressive )  Noted Brittany Mccarthy to be very non - nutritive on the left and nutritive on the right.  LC reviewed basics with mom and stressed the importance of breast  compressions with the feeding  To keep Brittany Mccarthy in a intermittent  nutritive feeding pattern.  Mom aware of the difference of nutritive vs non - nutritive  Mom receptive to plan and likes  the options (LC recommended using the plan from last week 9/27 for  Options. ( per mom still has the sheet )  See LC Plan below and breast feeding goals.  Lactation Plan of Care:  Praised mom for her breast feeding efforts and following the LC Plan  Breast feeding goals - Continue to increase Brittany Mccarthy weight  Protect milk supply  Watch for hanging out - non - nutritive feeding patterns  Brittany Mccarthy needs to participate intermittently  #20  NS to be used ( decrease from #24 NS )  Decrease to lower flange for post pumping - keep in mind when you are really full may be larger flange  Weight check - Wednesday 10/4 with Dr, Dario Guardian per mom [facility] recommends weight check with [facility] ( Family connects next Monday or Tuesday )  After next week [facility] group weekly for weight check.

## 2015-12-22 ENCOUNTER — Encounter (HOSPITAL_COMMUNITY): Payer: Self-pay | Admitting: Advanced Practice Midwife

## 2015-12-22 ENCOUNTER — Inpatient Hospital Stay (HOSPITAL_COMMUNITY): Payer: 59

## 2015-12-22 ENCOUNTER — Inpatient Hospital Stay (HOSPITAL_COMMUNITY)
Admission: AD | Admit: 2015-12-22 | Discharge: 2015-12-22 | Disposition: A | Discharge status: Home / Self Care | Payer: 59 | Source: Ambulatory Visit | Attending: Obstetrics and Gynecology | Admitting: Obstetrics and Gynecology

## 2015-12-22 DIAGNOSIS — M1990 Unspecified osteoarthritis, unspecified site: Secondary | ICD-10-CM | POA: Diagnosis not present

## 2015-12-22 DIAGNOSIS — N200 Calculus of kidney: Secondary | ICD-10-CM

## 2015-12-22 DIAGNOSIS — N132 Hydronephrosis with renal and ureteral calculous obstruction: Secondary | ICD-10-CM | POA: Diagnosis not present

## 2015-12-22 DIAGNOSIS — R109 Unspecified abdominal pain: Secondary | ICD-10-CM

## 2015-12-22 DIAGNOSIS — Z888 Allergy status to other drugs, medicaments and biological substances status: Secondary | ICD-10-CM | POA: Insufficient documentation

## 2015-12-22 DIAGNOSIS — Z79899 Other long term (current) drug therapy: Secondary | ICD-10-CM | POA: Insufficient documentation

## 2015-12-22 LAB — URINE MICROSCOPIC-ADD ON

## 2015-12-22 LAB — CBC
HCT: 42.8 % (ref 36.0–46.0)
HEMOGLOBIN: 15 g/dL (ref 12.0–15.0)
MCH: 32.8 pg (ref 26.0–34.0)
MCHC: 35 g/dL (ref 30.0–36.0)
MCV: 93.7 fL (ref 78.0–100.0)
Platelets: 216 10*3/uL (ref 150–400)
RBC: 4.57 MIL/uL (ref 3.87–5.11)
RDW: 12.8 % (ref 11.5–15.5)
WBC: 14.3 10*3/uL — ABNORMAL HIGH (ref 4.0–10.5)

## 2015-12-22 LAB — URINALYSIS, ROUTINE W REFLEX MICROSCOPIC
Bilirubin Urine: NEGATIVE
GLUCOSE, UA: NEGATIVE mg/dL
KETONES UR: 15 mg/dL — AB
NITRITE: NEGATIVE
PROTEIN: 30 mg/dL — AB
Specific Gravity, Urine: 1.02 (ref 1.005–1.030)
pH: 6 (ref 5.0–8.0)

## 2015-12-22 MED ORDER — ONDANSETRON 8 MG PO TBDP: 8.0000 mg | ORAL_TABLET | Freq: Three times a day (TID) | ORAL | 2 refills | Status: DC | PRN

## 2015-12-22 MED ORDER — SODIUM CHLORIDE 0.9 % IV BOLUS (SEPSIS): 500.0000 mL | Freq: Once | INTRAVENOUS | Status: DC

## 2015-12-22 MED ORDER — TAMSULOSIN HCL 0.4 MG PO CAPS: 0.4000 mg | ORAL_CAPSULE | Freq: Every day | ORAL | 0 refills | Status: DC

## 2015-12-22 MED ORDER — DEXTROSE 5 % IV SOLN
1.0000 g | Freq: Two times a day (BID) | INTRAVENOUS | Status: DC
  Administered 2015-12-22: 1 g via INTRAVENOUS
  Filled 2015-12-22: qty 10

## 2015-12-22 MED ORDER — OXYCODONE-ACETAMINOPHEN 5-325 MG PO TABS: 1.0000 | ORAL_TABLET | Freq: Four times a day (QID) | ORAL | 0 refills | Status: DC | PRN

## 2015-12-22 MED ORDER — MORPHINE SULFATE (PF) 4 MG/ML IV SOLN
4.0000 mg | Freq: Once | INTRAVENOUS | Status: AC
  Administered 2015-12-22: 4 mg via INTRAVENOUS
  Filled 2015-12-22: qty 1

## 2015-12-22 MED ORDER — CEPHALEXIN 500 MG PO CAPS: 500.0000 mg | ORAL_CAPSULE | Freq: Four times a day (QID) | ORAL | 0 refills | Status: DC

## 2015-12-22 MED ORDER — HYDROMORPHONE HCL 1 MG/ML IJ SOLN
1.0000 mg | Freq: Once | INTRAMUSCULAR | Status: AC
  Administered 2015-12-22: 1 mg via INTRAVENOUS
  Filled 2015-12-22: qty 1

## 2015-12-22 NOTE — MAU Provider Note (Signed)
Chief Complaint: Nausea; Emesis; and Back Pain   First Provider Initiated Contact with Patient 12/22/15 1557      SUBJECTIVE HPI: Brittany Mccarthy is a 30 y.o. G1P1001 who is 2 weeks s/p NSVD presents to maternity admissions reporting onset of right flank and right lower back pain 2 days ago, worsening today. The pain is constant, but waxes and wanes and is associated with nausea and vomiting, chills but no recorded fever, and mild dysuria.  She has tried ibuprofen and heat which help but do not relieve the pain completely.  She has a significant family but not personal history of kidney stones. She is having light vaginal bleeding, much lighter than when she left the hospital after her baby was born and denies abdominal pain. She denies vaginal itching/burning, h/a, or dizziness.   HPI  Past Medical History:  Diagnosis Date  . Arthritis   . Pregnant   . Seizures (HCC)    Febrile seizures when young   Past Surgical History:  Procedure Laterality Date  . EYE SURGERY    . HERNIA REPAIR    . WISDOM TOOTH EXTRACTION     Social History   Social History  . Marital status: Married    Spouse name: N/A  . Number of children: N/A  . Years of education: N/A   Occupational History  . Not on file.   Social History Main Topics  . Smoking status: Never Smoker  . Smokeless tobacco: Never Used  . Alcohol use No  . Drug use: Unknown  . Sexual activity: Yes   Other Topics Concern  . Not on file   Social History Narrative  . No narrative on file   No current facility-administered medications on file prior to encounter.    Current Outpatient Prescriptions on File Prior to Encounter  Medication Sig Dispense Refill  . Prenatal Vit-Fe Fumarate-FA (PRENATAL MULTIVITAMIN) TABS tablet Take 1 tablet by mouth at bedtime.     Allergies  Allergen Reactions  . Fish Allergy Anaphylaxis    ROS:  Review of Systems  Constitutional: Negative for chills, fatigue and fever.  Respiratory: Negative  for shortness of breath.   Cardiovascular: Negative for chest pain.  Gastrointestinal: Positive for nausea and vomiting.  Genitourinary: Positive for dysuria, flank pain and vaginal bleeding. Negative for difficulty urinating, pelvic pain, vaginal discharge and vaginal pain.  Musculoskeletal: Positive for back pain.  Neurological: Negative for dizziness and headaches.  Psychiatric/Behavioral: Negative.      I have reviewed patient's Past Medical Hx, Surgical Hx, Family Hx, Social Hx, medications and allergies.   Physical Exam   Patient Vitals for the past 24 hrs:  BP Temp Temp src Pulse Resp Height Weight  12/22/15 1345 110/72 98.1 F (36.7 C) Oral 118 18 - -  12/22/15 1333 - - - - - 5\' 10"  (1.778 m) 186 lb 1.9 oz (84.4 kg)  12/22/15 1332 - - - - - 5\' 10"  (1.778 m) 186 lb 1.9 oz (84.4 kg)   Constitutional: Well-developed, well-nourished female in no acute distress.  Cardiovascular: normal rate Respiratory: normal effort GI: Abd soft, non-tender. No rebound tenderness or guarding.  Pos BS x 4 MS: Extremities nontender, no edema, normal ROM Neurologic: Alert and oriented x 4.  GU: Positive CVAT on right  PELVIC EXAM: Deferred   LAB RESULTS Results for orders placed or performed during the hospital encounter of 12/22/15 (from the past 24 hour(s))  Urinalysis, Routine w reflex microscopic (not at [facility])  Status: Abnormal   Collection Time: 12/22/15  1:30 PM  Result Value Ref Range   Color, Urine AMBER (A) YELLOW   APPearance CLEAR CLEAR   Specific Gravity, Urine 1.020 1.005 - 1.030   pH 6.0 5.0 - 8.0   Glucose, UA NEGATIVE NEGATIVE mg/dL   Hgb urine dipstick LARGE (A) NEGATIVE   Bilirubin Urine NEGATIVE NEGATIVE   Ketones, ur 15 (A) NEGATIVE mg/dL   Protein, ur 30 (A) NEGATIVE mg/dL   Nitrite NEGATIVE NEGATIVE   Leukocytes, UA MODERATE (A) NEGATIVE  Urine microscopic-add on     Status: Abnormal   Collection Time: 12/22/15  1:30 PM  Result Value Ref Range   Squamous  Epithelial / LPF 0-5 (A) NONE SEEN   WBC, UA 6-30 0 - 5 WBC/hpf   RBC / HPF TOO NUMEROUS TO COUNT 0 - 5 RBC/hpf   Bacteria, UA MANY (A) NONE SEEN  CBC     Status: Abnormal   Collection Time: 12/22/15  3:13 PM  Result Value Ref Range   WBC 14.3 (H) 4.0 - 10.5 K/uL   RBC 4.57 3.87 - 5.11 MIL/uL   Hemoglobin 15.0 12.0 - 15.0 g/dL   HCT 13.042.8 86.536.0 - 78.446.0 %   MCV 93.7 78.0 - 100.0 fL   MCH 32.8 26.0 - 34.0 pg   MCHC 35.0 30.0 - 36.0 g/dL   RDW 69.612.8 29.511.5 - 28.415.5 %   Platelets 216 150 - 400 K/uL    --/--/A POS, A POS (09/21 0050)  IMAGING No results found.   MDM: Consult Dr Hinton RaoBovard-Stuckert with lab results and following pt physical exam. Renal CT scan ordered and completed with results pending  Report to [place] El[place], CNM  CT still not read. Pt's significant other anxious to leave. Radiologist called again. Images now crossing over. Awaiting report. Discussed w/ Dr. Ellyn HackBovard. OK to D/C pt w/out final report if desired. Also discussed UA and pt reports or frequency of urination. Will Tx for possible pyelo w/ Rocephin here and PO ABX at D/C. Cultures sent.  Pt and family updated of difficulties w/ getting CT read. Would like to be discharged and called w/ results.  CT Results back:  Ct Renal Stone Study  Result Date: 12/22/2015 CLINICAL DATA:  Right flank pain. EXAM: CT ABDOMEN AND PELVIS WITHOUT CONTRAST TECHNIQUE: Multidetector CT imaging of the abdomen and pelvis was performed following the standard protocol without IV contrast. COMPARISON:  None. FINDINGS: Lower chest: No acute abnormality. Hepatobiliary: Multiple low-attenuation lesions in the liver are almost certainly cysts although several are too small to completely characterize. The gallbladder is normal in appearance. Pancreas: Unremarkable. No pancreatic ductal dilatation or surrounding inflammatory changes. Spleen: Normal in size without focal abnormality. Adrenals/Urinary Tract: There is a cluster of small stones is seen in the  lower pole of the left kidney. No right-sided renal stones are identified. No hydronephrosis or perinephric stranding on the left. No ureterectasis or ureteral stone on the left. Calcifications in the left side of the pelvis are thought to be phleboliths. There is mild to moderate hydronephrosis and ureterectasis on the right with a stone in the distal right ureter just above the UVJ measuring 4.5 mm. The bladder is unremarkable. Stomach/Bowel: The stomach and small bowel are normal. The sigmoid colon is redundant but no evidence of volvulus. The transverse colon is also redundant with mild gaseous distension but no evidence of obstruction. The colon is otherwise normal. The appendix is unremarkable as well with no appendicitis. Vascular/Lymphatic: No  significant vascular findings are present. No enlarged abdominal or pelvic lymph nodes. Reproductive: Uterus and bilateral adnexa are unremarkable. Other: No abdominal wall hernia or abnormality. No abdominopelvic ascites. Musculoskeletal: No acute or significant osseous findings. IMPRESSION: 1. 4.5 mm stone in the distal right ureter with mild to moderate hydronephrosis and ureterectasis. 2. Nonobstructive stones in the lower left kidney. Electronically Signed   By: Gerome Sam III M.D   On: 12/22/2015 20:57   Discussed w/ Dr. Ellyn Hack and pt. Office will make urology referral OP.   ASSESSMENT 1. Acute right flank pain   2. Right flank pain   3. Bilateral kidney stones   4.      Possible pyelonephritis.   PLAN D/C home in stable condition Pyelo, kidney stone precautions.  Push fluids.  Strain urine Follow-up Information    [facility] .   Why:  will arrange referral to urologist  Contact information: [address]  [address] [place] [address] [phone]        [facility] EMERGENCY DEPARTMENT .   Specialty:  Emergency Medicine Why:  as needed if symptoms worsen or if you cannot keep down your  medicines.  Contact information: [address] [id number] mc [place] [address] [phone]           Medication List    TAKE these medications   acetaminophen 500 MG tablet Commonly known as:  TYLENOL Take 1,000 mg by mouth every 6 (six) hours as needed for mild pain, moderate pain or headache.   cephALEXin 500 MG capsule Commonly known as:  KEFLEX Take 1 capsule (500 mg total) by mouth 4 (four) times daily.   ibuprofen 600 MG tablet Commonly known as:  ADVIL,MOTRIN Take 600 mg by mouth every 6 (six) hours as needed for mild pain or moderate pain.   ondansetron 8 MG disintegrating tablet Commonly known as:  ZOFRAN ODT Take 1 tablet (8 mg total) by mouth every 8 (eight) hours as needed for nausea or vomiting.   oxyCODONE-acetaminophen 5-325 MG tablet Commonly known as:  PERCOCET/ROXICET Take 1-2 tablets by mouth every 6 (six) hours as needed.   prenatal multivitamin Tabs tablet Take 1 tablet by mouth at bedtime.   tamsulosin 0.4 MG Caps capsule Commonly known as:  FLOMAX Take 1 capsule (0.4 mg total) by mouth daily.        Sharen Counter Certified Nurse-Midwife 12/22/2015  8:30 PM  Dorathy Kinsman, CNM 12/22/2015 10:07 PM

## 2015-12-22 NOTE — Discharge Instructions (Signed)
Kidney Stones °Kidney stones (urolithiasis) are deposits that form inside your kidneys. The intense pain is caused by the stone moving through the urinary tract. When the stone moves, the ureter goes into spasm around the stone. The stone is usually passed in the urine.  °CAUSES  °· A disorder that makes certain neck glands produce too much parathyroid hormone (primary hyperparathyroidism). °· A buildup of uric acid crystals, similar to gout in your joints. °· Narrowing (stricture) of the ureter. °· A kidney obstruction present at birth (congenital obstruction). °· Previous surgery on the kidney or ureters. °· Numerous kidney infections. °SYMPTOMS  °· Feeling sick to your stomach (nauseous). °· Throwing up (vomiting). °· Blood in the urine (hematuria). °· Pain that usually spreads (radiates) to the groin. °· Frequency or urgency of urination. °DIAGNOSIS  °· Taking a history and physical exam. °· Blood or urine tests. °· CT scan. °· Occasionally, an examination of the inside of the urinary bladder (cystoscopy) is performed. °TREATMENT  °· Observation. °· Increasing your fluid intake. °· Extracorporeal shock wave lithotripsy--This is a noninvasive procedure that uses shock waves to break up kidney stones. °· Surgery may be needed if you have severe pain or persistent obstruction. There are various surgical procedures. Most of the procedures are performed with the use of small instruments. Only small incisions are needed to accommodate these instruments, so recovery time is minimized. °The size, location, and chemical composition are all important variables that will determine the proper choice of action for you. Talk to your health care provider to better understand your situation so that you will minimize the risk of injury to yourself and your kidney.  °HOME CARE INSTRUCTIONS  °· Drink enough water and fluids to keep your urine clear or pale yellow. This will help you to pass the stone or stone fragments. °· Strain  all urine through the provided strainer. Keep all particulate matter and stones for your health care provider to see. The stone causing the pain may be as small as a grain of salt. It is very important to use the strainer each and every time you pass your urine. The collection of your stone will allow your health care provider to analyze it and verify that a stone has actually passed. The stone analysis will often identify what you can do to reduce the incidence of recurrences. °· Only take over-the-counter or prescription medicines for pain, discomfort, or fever as directed by your health care provider. °· Keep all follow-up visits as told by your health care provider. This is important. °· Get follow-up X-rays if required. The absence of pain does not always mean that the stone has passed. It may have only stopped moving. If the urine remains completely obstructed, it can cause loss of kidney function or even complete destruction of the kidney. It is your responsibility to make sure X-rays and follow-ups are completed. Ultrasounds of the kidney can show blockages and the status of the kidney. Ultrasounds are not associated with any radiation and can be performed easily in a matter of minutes. °· Make changes to your daily diet as told by your health care provider. You may be told to: °¨ Limit the amount of salt that you eat. °¨ Eat 5 or more servings of fruits and vegetables each day. °¨ Limit the amount of meat, poultry, fish, and eggs that you eat. °· Collect a 24-hour urine sample as told by your health care provider. You may need to collect another urine sample every 6-12   months. °SEEK MEDICAL CARE IF: °· You experience pain that is progressive and unresponsive to any pain medicine you have been prescribed. °SEEK IMMEDIATE MEDICAL CARE IF:  °· Pain cannot be controlled with the prescribed medicine. °· You have a fever or shaking chills. °· The severity or intensity of pain increases over 18 hours and is not  relieved by pain medicine. °· You develop a new onset of abdominal pain. °· You feel faint or pass out. °· You are unable to urinate. °  °This information is not intended to replace advice given to you by your health care provider. Make sure you discuss any questions you have with your health care provider. °  °Document Released: 03/03/2005 Document Revised: 11/22/2014 Document Reviewed: 08/04/2012 °Elsevier Interactive Patient Education ©2016 [employer] ° °Pyelonephritis, Adult °Pyelonephritis is a kidney infection. The kidneys are the organs that filter a person's blood and move waste out of the bloodstream and into the urine. Urine passes from the kidneys, through the ureters, and into the bladder. There are two main types of pyelonephritis: °· Infections that come on quickly without any warning (acute pyelonephritis). °· Infections that last for a long period of time (chronic pyelonephritis). °In most cases, the infection clears up with treatment and does not cause further problems. More severe infections or chronic infections can sometimes spread to the bloodstream or lead to other problems with the kidneys. °CAUSES °This condition is usually caused by: °· Bacteria traveling from the bladder to the kidney through infected urine. The urine in the bladder can become infected with bacteria from: °¨ Bladder infection (cystitis). °¨ Inflammation of the prostate gland (prostatitis). °¨ Sexual intercourse, in females. °· Bacteria traveling from the bloodstream to the kidney. °RISK FACTORS °This condition is more likely to develop in: °· Pregnant women. °· Older people. °· People who have diabetes. °· People who have kidney stones or bladder stones. °· People who have other abnormalities of the kidney or ureter. °· People who have a catheter placed in the bladder. °· People who have cancer. °· People who are sexually active. °· Women who use spermicides. °· People who have had a prior urinary tract  infection. °SYMPTOMS °Symptoms of this condition include: °· Frequent urination. °· Strong or persistent urge to urinate. °· Burning or stinging when urinating. °· Abdominal pain. °· Back pain. °· Pain in the side or flank area. °· Fever. °· Chills. °· Blood in the urine, or dark urine. °· Nausea. °· Vomiting. °DIAGNOSIS °This condition may be diagnosed based on: °· Medical history and physical exam. °· Urine tests. °· Blood tests. °You may also have imaging tests of the kidneys, such as an ultrasound or CT scan. °TREATMENT °Treatment for this condition may depend on the severity of the infection. °· If the infection is mild and is found early, you may be treated with antibiotic medicines taken by mouth. You will need to drink fluids to remain hydrated. °· If the infection is more severe, you may need to stay in the hospital and receive antibiotics given directly into a vein through an IV tube. You may also need to receive fluids through an IV tube if you are not able to remain hydrated. After your hospital stay, you may need to take oral antibiotics for a period of time. °Other treatments may be required, depending on the cause of the infection. °HOME CARE INSTRUCTIONS °Medicines °· Take over-the-counter and prescription medicines only as told by your health care provider. °· If you were   prescribed an antibiotic medicine, take it as told by your health care provider. Do not stop taking the antibiotic even if you start to feel better. °General Instructions °· Drink enough fluid to keep your urine clear or pale yellow. °· Avoid caffeine, tea, and carbonated beverages. They tend to irritate the bladder. °· Urinate often. Avoid holding in urine for long periods of time. °· Urinate before and after sex. °· After a bowel movement, women should cleanse from front to back. Use each tissue only once. °· Keep all follow-up visits as told by your health care provider. This is important. °SEEK MEDICAL CARE IF: °· Your symptoms  do not get better after 2 days of treatment. °· Your symptoms get worse. °· You have a fever. °SEEK IMMEDIATE MEDICAL CARE IF: °· You are unable to take your antibiotics or fluids. °· You have shaking chills. °· You vomit. °· You have severe flank or back pain. °· You have extreme weakness or fainting. °  °This information is not intended to replace advice given to you by your health care provider. Make sure you discuss any questions you have with your health care provider. °  °Document Released: 03/03/2005 Document Revised: 11/22/2014 Document Reviewed: 06/26/2014 °Elsevier Interactive Patient Education ©2016 [employer] Inc[employer] ° °

## 2015-12-22 NOTE — MAU Note (Signed)
Patient presents with right flank pain which started Thursday morning has worsened in intensity, having mild discomfort upon urination.

## 2015-12-24 LAB — CULTURE, OB URINE

## 2016-01-30 ENCOUNTER — Ambulatory Visit (INDEPENDENT_AMBULATORY_CARE_PROVIDER_SITE_OTHER): Payer: 59

## 2016-01-30 ENCOUNTER — Ambulatory Visit (INDEPENDENT_AMBULATORY_CARE_PROVIDER_SITE_OTHER): Payer: 59 | Admitting: Podiatry

## 2016-01-30 VITALS — BP 120/83 | HR 71 | Temp 98.2°F | Resp 16 | Ht 69.0 in | Wt 180.0 lb

## 2016-01-30 DIAGNOSIS — M79672 Pain in left foot: Secondary | ICD-10-CM | POA: Diagnosis not present

## 2016-01-30 DIAGNOSIS — G629 Polyneuropathy, unspecified: Secondary | ICD-10-CM

## 2016-01-30 DIAGNOSIS — M779 Enthesopathy, unspecified: Secondary | ICD-10-CM | POA: Diagnosis not present

## 2016-01-30 MED ORDER — DICLOFENAC SODIUM 75 MG PO TBEC: 75.0000 mg | DELAYED_RELEASE_TABLET | Freq: Two times a day (BID) | ORAL | 2 refills | Status: DC

## 2016-01-30 NOTE — Progress Notes (Signed)
   Subjective:    Patient ID: Brittany Mccarthy, female    DOB: 1985/04/16, 30 y.o.   MRN: [id number]  HPI    Review of Systems  All other systems reviewed and are negative.      Objective:   Physical Exam        Assessment & Plan:

## 2016-01-31 NOTE — Progress Notes (Signed)
Subjective:     Patient ID: Brittany Mccarthy, female   DOB: Oct 11, 1985, 30 y.o.   MRN: [id number]  HPI patient states she had a baby 7 weeks ago and is developed progressive pain in the leg with the leg itself getting better but the foot and ankle has still been tingling and burning and they felt like she may have had a nerve compression   Review of Systems  All other systems reviewed and are negative.      Objective:   Physical Exam  Constitutional: She is oriented to person, place, and time.  Cardiovascular: Intact distal pulses.   Musculoskeletal: Normal range of motion.  Neurological: She is oriented to person, place, and time.  Skin: Skin is warm.  Nursing note and vitals reviewed.  neurovascular status was found to be intact with patient's sharp Dole vibratory in place with slight hyperreflexia noted. I checked all tendons and muscles and found them to be intact with no indication of foot drop or other muscular pathology with this tingling pain that's present     Assessment:     Most likely this is due to a back compression which I'm encouraging her to see physician for with possibility she'll need MRI    Plan:     H&P condition reviewed and she will seek care from her neuro doctor. She'll be seen back by us if she should develop any muscle weakness

## 2016-03-18 DIAGNOSIS — M5136 Other intervertebral disc degeneration, lumbar region: Secondary | ICD-10-CM | POA: Diagnosis not present

## 2016-03-21 DIAGNOSIS — M5136 Other intervertebral disc degeneration, lumbar region: Secondary | ICD-10-CM | POA: Diagnosis not present

## 2016-03-25 DIAGNOSIS — M5136 Other intervertebral disc degeneration, lumbar region: Secondary | ICD-10-CM | POA: Diagnosis not present

## 2016-03-28 DIAGNOSIS — M5136 Other intervertebral disc degeneration, lumbar region: Secondary | ICD-10-CM | POA: Diagnosis not present

## 2016-04-01 DIAGNOSIS — M5136 Other intervertebral disc degeneration, lumbar region: Secondary | ICD-10-CM | POA: Diagnosis not present

## 2016-04-04 DIAGNOSIS — M5136 Other intervertebral disc degeneration, lumbar region: Secondary | ICD-10-CM | POA: Diagnosis not present

## 2016-04-09 DIAGNOSIS — M5136 Other intervertebral disc degeneration, lumbar region: Secondary | ICD-10-CM | POA: Diagnosis not present

## 2016-04-11 DIAGNOSIS — M5136 Other intervertebral disc degeneration, lumbar region: Secondary | ICD-10-CM | POA: Diagnosis not present

## 2016-04-25 DIAGNOSIS — M5136 Other intervertebral disc degeneration, lumbar region: Secondary | ICD-10-CM | POA: Diagnosis not present

## 2016-12-28 IMAGING — CT CT RENAL STONE PROTOCOL
2 of 4 series · 16 of 46 positions shown, 18 images · non-contrast
Comparison: None.

CLINICAL DATA: Right flank pain.

EXAM:
CT ABDOMEN AND PELVIS WITHOUT CONTRAST
TECHNIQUE: Multidetector CT imaging of the abdomen and pelvis was performed
following the standard protocol without IV contrast.

[Series 2: stone · axial · 0.71mm/px · z∈[-476,-41]mm · 13 of 95 slices shown, 15 images]
[im 4/95  soft-tissue]
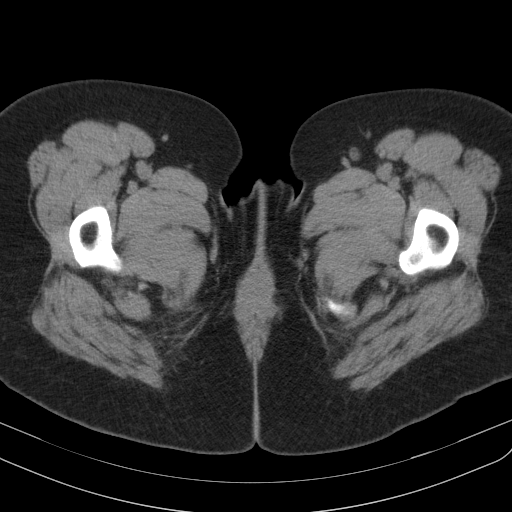
[im 4/95  bone]
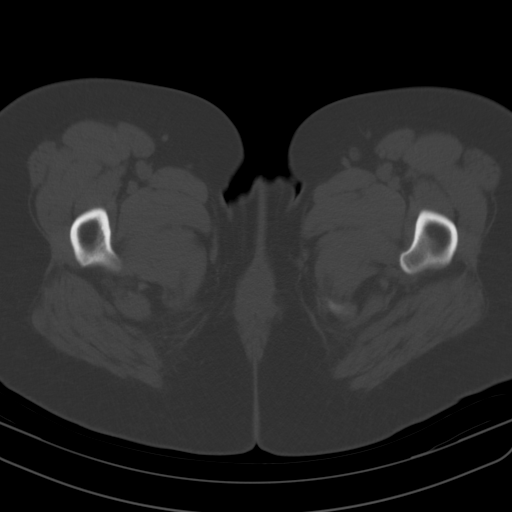
[im 12/95  soft-tissue]
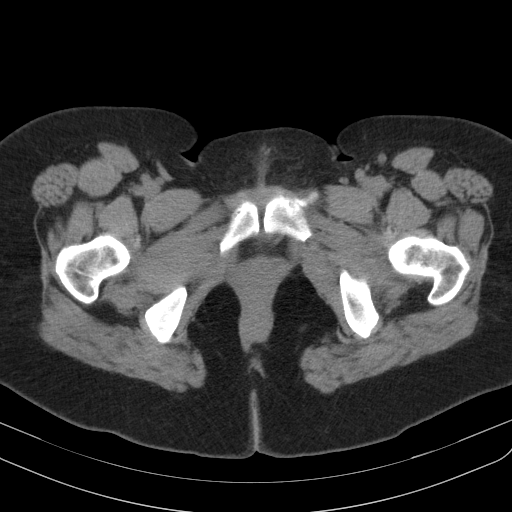
[im 19/95  soft-tissue]
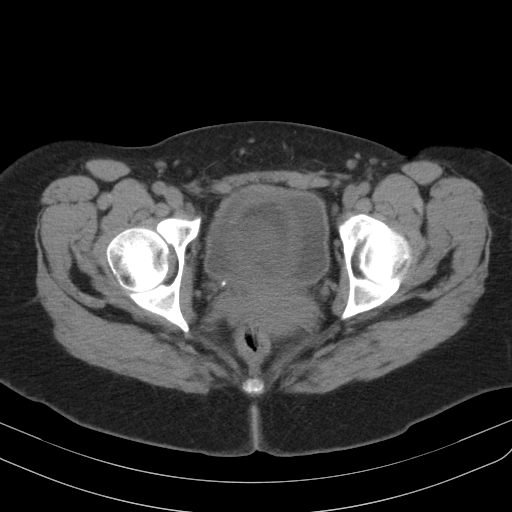
[im 27/95  soft-tissue]
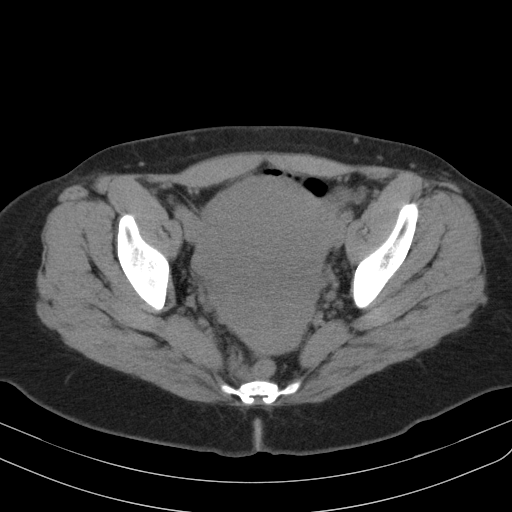
[im 34/95  soft-tissue]
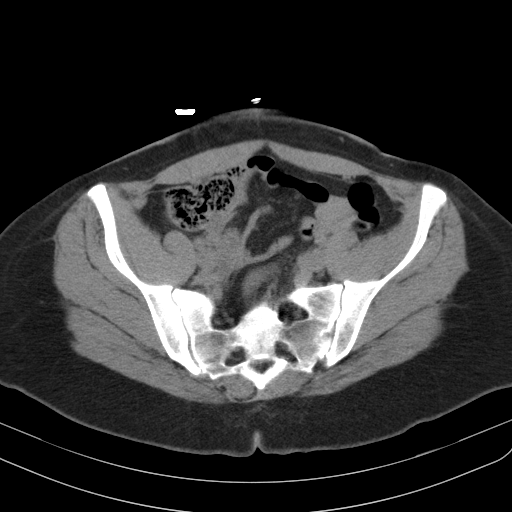
[im 42/95  soft-tissue]
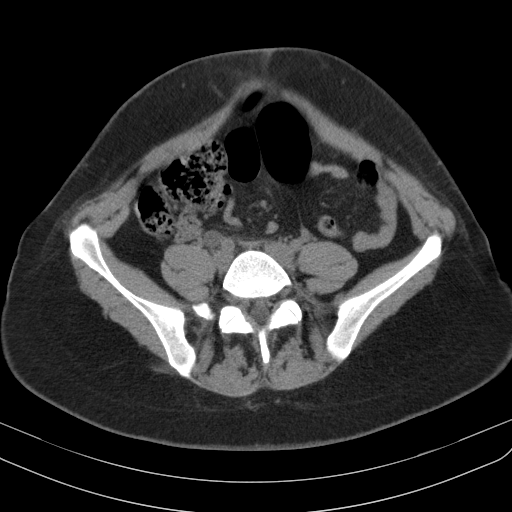
[im 49/95  soft-tissue]
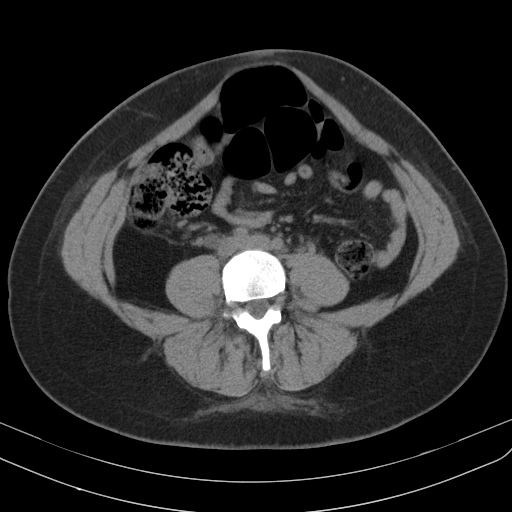
[im 53/95  soft-tissue]
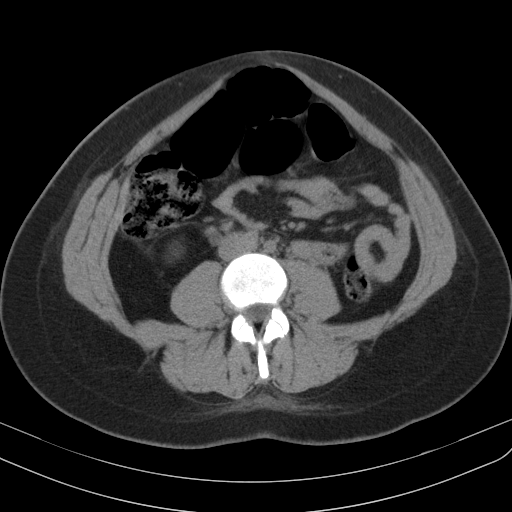
[im 61/95  soft-tissue]
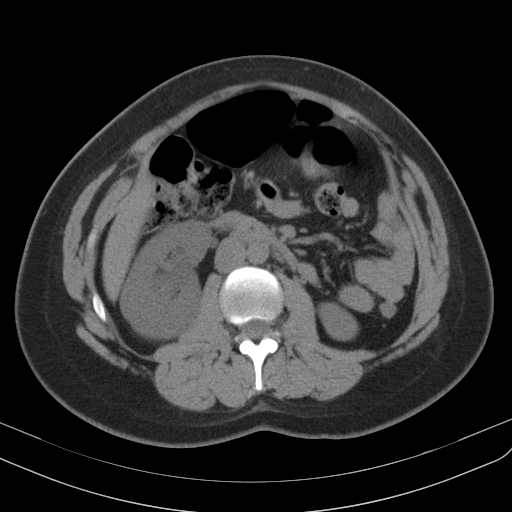
[im 61/95  bone]
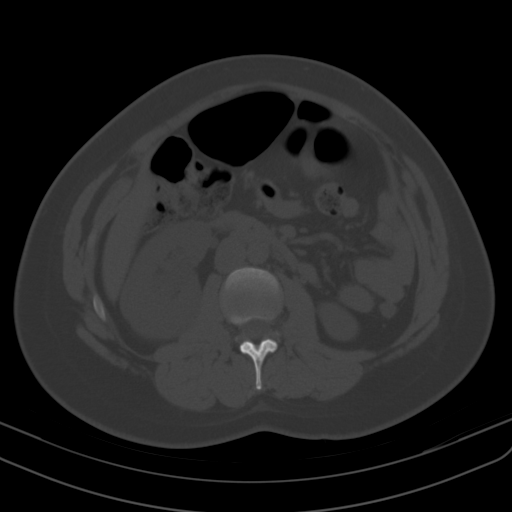
[im 68/95  soft-tissue]
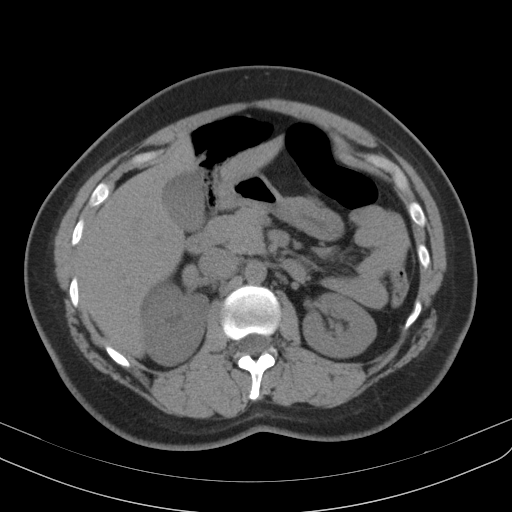
[im 76/95  soft-tissue]
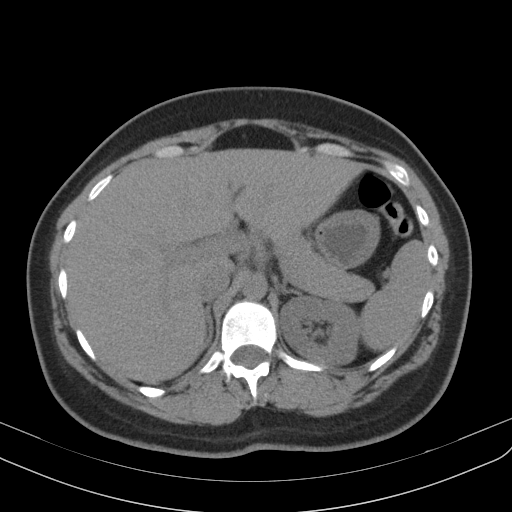
[im 83/95  soft-tissue]
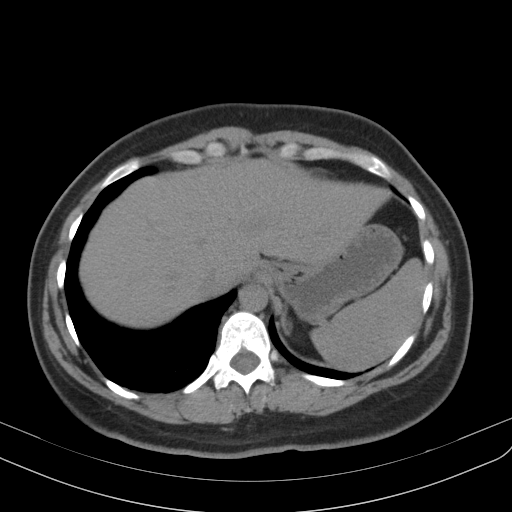
[im 91/95  soft-tissue]
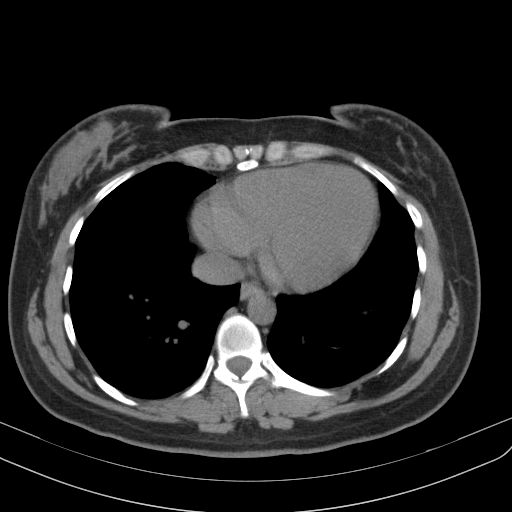

[Series 602: <mpr thick range> · coronal · 0.93mm/px · 3 of 162 slices shown]
[im 54/162  soft-tissue]
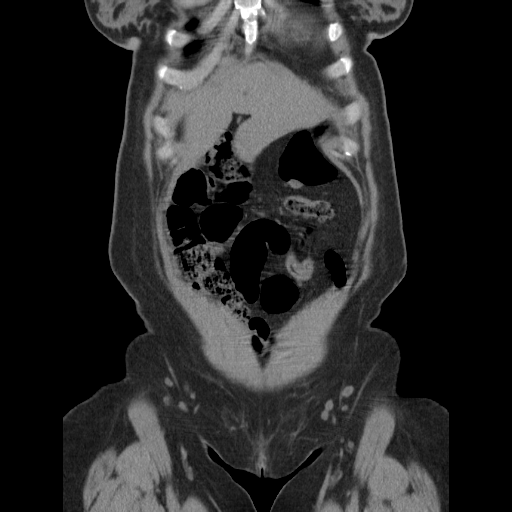
[im 72/162  soft-tissue]
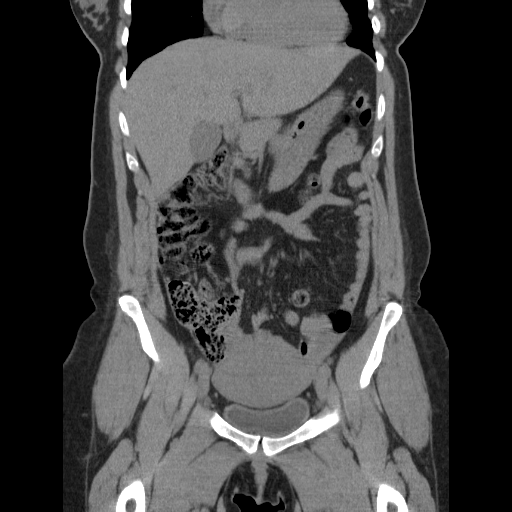
[im 90/162  soft-tissue]
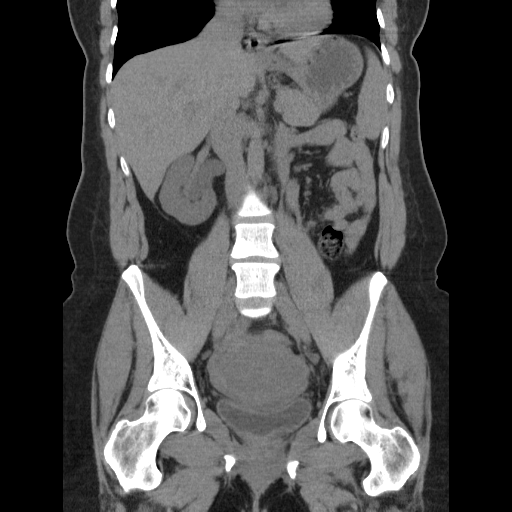

[16 of 46 positions shown; findings below may reference images not displayed]

FINDINGS: Lower chest: No acute abnormality.

Hepatobiliary: Multiple low-attenuation lesions in the liver are
almost certainly cysts although several are too small to completely
characterize. The gallbladder is normal in appearance.

Pancreas: Unremarkable. No pancreatic ductal dilatation or
surrounding inflammatory changes.

Spleen: Normal in size without focal abnormality.

Adrenals/Urinary Tract: There is a cluster of small stones is seen
in the lower pole of the left kidney. No right-sided renal stones
are identified. No hydronephrosis or perinephric stranding on the
left. No ureterectasis or ureteral stone on the left. Calcifications
in the left side of the pelvis are thought to be phleboliths. There
is mild to moderate hydronephrosis and ureterectasis on the right
with a stone in the distal right ureter just above the UVJ measuring
4.5 mm. The bladder is unremarkable.

Stomach/Bowel: The stomach and small bowel are normal. The sigmoid
colon is redundant but no evidence of volvulus. The transverse colon
is also redundant with mild gaseous distension but no evidence of
obstruction. The colon is otherwise normal. The appendix is
unremarkable as well with no appendicitis.

Vascular/Lymphatic: No significant vascular findings are present. No
enlarged abdominal or pelvic lymph nodes.

Reproductive: Uterus and bilateral adnexa are unremarkable.

Other: No abdominal wall hernia or abnormality. No abdominopelvic
ascites.

Musculoskeletal: No acute or significant osseous findings.
IMPRESSION: 1. 4.5 mm stone in the distal right ureter with mild to moderate
hydronephrosis and ureterectasis.
2. Nonobstructive stones in the lower left kidney.

## 2017-01-19 DIAGNOSIS — Z23 Encounter for immunization: Secondary | ICD-10-CM | POA: Diagnosis not present

## 2017-02-27 DIAGNOSIS — Z01419 Encounter for gynecological examination (general) (routine) without abnormal findings: Secondary | ICD-10-CM | POA: Diagnosis not present

## 2017-02-27 DIAGNOSIS — Z124 Encounter for screening for malignant neoplasm of cervix: Secondary | ICD-10-CM | POA: Diagnosis not present

## 2017-02-27 LAB — HM PAP SMEAR: HM Pap smear: NORMAL

## 2017-03-31 DIAGNOSIS — J069 Acute upper respiratory infection, unspecified: Secondary | ICD-10-CM | POA: Diagnosis not present

## 2017-05-19 ENCOUNTER — Encounter: Payer: Self-pay | Admitting: Medical

## 2017-05-19 ENCOUNTER — Ambulatory Visit: Payer: 59 | Admitting: Medical

## 2017-05-19 VITALS — BP 120/68 | HR 85 | Temp 98.0°F | Resp 16 | Ht 70.0 in | Wt 158.4 lb

## 2017-05-19 DIAGNOSIS — T7840XA Allergy, unspecified, initial encounter: Secondary | ICD-10-CM | POA: Diagnosis not present

## 2017-05-19 MED ORDER — TRIAMCINOLONE ACETONIDE 0.1 % EX CREA: 1.0000 "application " | TOPICAL_CREAM | Freq: Two times a day (BID) | CUTANEOUS | 0 refills | Status: DC

## 2017-05-19 MED ORDER — PREDNISONE 10 MG PO TABS: ORAL_TABLET | ORAL | 0 refills | Status: DC

## 2017-05-19 NOTE — Progress Notes (Signed)
Subjective:    Patient ID: Brittany Mccarthy, female    DOB: February 19, 1986, 32 y.o.   MRN: [id number]  HPI  Rt wrist rash on Saturday morning. No yard work, no new soaps, detergent or creams prior to onset. No known insect bite. No recent cleaning storage sheds or garage.   Itches moderately. No other location.  Benadryl helps with itch.  LM- 05-14-2017   Review of Systems  Constitutional: Negative for chills, diaphoresis, fatigue and fever.  Respiratory: Negative for cough, chest tightness, shortness of breath and wheezing.   Cardiovascular: Negative for chest pain and palpitations.  Gastrointestinal: Negative for abdominal distention, abdominal pain, anal bleeding, rectal pain and vomiting.  Musculoskeletal: Negative for arthralgias, back pain and gait problem.  Skin: Negative for rash.       Skin rash.  Hematological: Negative for adenopathy. Does not bruise/bleed easily.  Psychiatric/Behavioral: Negative for behavioral problems and confusion.    Past Medical History:  Diagnosis Date  . Arthritis   . Pregnant   . Seizures (HCC)    Febrile seizures when young     Social History   Socioeconomic History  . Marital status: Married    Spouse name: Not on file  . Number of children: Not on file  . Years of education: Not on file  . Highest education level: Not on file  Social Needs  . Financial resource strain: Not on file  . Food insecurity - worry: Not on file  . Food insecurity - inability: Not on file  . Transportation needs - medical: Not on file  . Transportation needs - non-medical: Not on file  Occupational History  . Not on file  Tobacco Use  . Smoking status: Never Smoker  . Smokeless tobacco: Never Used  Substance and Sexual Activity  . Alcohol use: No    Alcohol/week: 0.0 oz  . Drug use: Not on file  . Sexual activity: Yes  Other Topics Concern  . Not on file  Social History Narrative  . Not on file    Past Surgical History:  Procedure Laterality  Date  . EYE SURGERY    . HERNIA REPAIR    . WISDOM TOOTH EXTRACTION      No family history on file.  Allergies  Allergen Reactions  . Fish Allergy Anaphylaxis    Current Outpatient Medications on File Prior to Visit  Medication Sig Dispense Refill  . acetaminophen (TYLENOL) 500 MG tablet Take 1,000 mg by mouth every 6 (six) hours as needed for mild pain, moderate pain or headache.    . diclofenac (VOLTAREN) 75 MG EC tablet Take 1 tablet (75 mg total) by mouth 2 (two) times daily. 50 tablet 2  . ibuprofen (ADVIL,MOTRIN) 600 MG tablet Take 600 mg by mouth every 6 (six) hours as needed for mild pain or moderate pain.    Marland Kitchen. oxyCODONE-acetaminophen (PERCOCET/ROXICET) 5-325 MG tablet Take 1-2 tablets by mouth every 6 (six) hours as needed. 30 tablet 0  . Prenatal Vit-Fe Fumarate-FA (PRENATAL MULTIVITAMIN) TABS tablet Take 1 tablet by mouth at bedtime.     No current facility-administered medications on file prior to visit.     BP 120/68 (BP Location: Left Arm, Patient Position: Sitting, Cuff Size: Small)   Pulse 85   Temp 98 F (36.7 C) (Oral)   Resp 16   Ht 5\' 10"  (1.778 m)   Wt 158 lb 6.4 oz (71.8 kg)   SpO2 98%   BMI 22.73 kg/m  Objective:   Physical Exam  General- No acute distress. Pleasant patient. Neck- Full range of motion, no jvd Lungs- Clear, even and unlabored. Heart- regular rate and rhythm. Neurologic- CNII- XII grossly intact.  Rt wrist- ventral aspect 1.5 cm mild rash. Slight papular rash.  No discharge present.  No warmth or fluctuance.  No obvious vesicles seen or ulcerations.      Assessment & Plan:  For allergic reaction will reaction rx taper prednisone, [place] benadryl otc and will rx kenalog cream.  Cause undetermined at this point. If area worsens or changes let us know.  If rash persists would refer to dermatologist  Follow up 7 days or needed  [employer], [employer]

## 2017-05-19 NOTE — Patient Instructions (Addendum)
For allergic reaction will reaction rx taper prednisone, us benadryl otc and will rx kenalog cream.  Cause undetermined at this point. If area worsens or changes let us know.  If rash persists would refer to dermatologist  Follow up 7 days or needed

## 2017-05-20 DIAGNOSIS — R293 Abnormal posture: Secondary | ICD-10-CM | POA: Diagnosis not present

## 2017-05-20 DIAGNOSIS — M2560 Stiffness of unspecified joint, not elsewhere classified: Secondary | ICD-10-CM | POA: Diagnosis not present

## 2017-05-20 DIAGNOSIS — M545 Low back pain: Secondary | ICD-10-CM | POA: Diagnosis not present

## 2017-05-25 DIAGNOSIS — M2560 Stiffness of unspecified joint, not elsewhere classified: Secondary | ICD-10-CM | POA: Diagnosis not present

## 2017-05-25 DIAGNOSIS — M545 Low back pain: Secondary | ICD-10-CM | POA: Diagnosis not present

## 2017-05-25 DIAGNOSIS — R293 Abnormal posture: Secondary | ICD-10-CM | POA: Diagnosis not present

## 2017-07-21 DIAGNOSIS — Z3201 Encounter for pregnancy test, result positive: Secondary | ICD-10-CM | POA: Diagnosis not present

## 2017-07-21 DIAGNOSIS — N911 Secondary amenorrhea: Secondary | ICD-10-CM | POA: Diagnosis not present

## 2017-08-20 DIAGNOSIS — O3680X0 Pregnancy with inconclusive fetal viability, not applicable or unspecified: Secondary | ICD-10-CM | POA: Diagnosis not present

## 2017-08-20 DIAGNOSIS — O021 Missed abortion: Secondary | ICD-10-CM | POA: Diagnosis not present

## 2017-09-07 ENCOUNTER — Ambulatory Visit: Payer: 59 | Admitting: Family Medicine

## 2017-09-07 ENCOUNTER — Encounter: Payer: Self-pay | Admitting: Family Medicine

## 2017-09-07 VITALS — BP 110/68 | HR 74 | Temp 98.1°F | Ht 70.0 in | Wt 156.2 lb

## 2017-09-07 DIAGNOSIS — J208 Acute bronchitis due to other specified organisms: Secondary | ICD-10-CM

## 2017-09-07 DIAGNOSIS — B9689 Other specified bacterial agents as the cause of diseases classified elsewhere: Secondary | ICD-10-CM | POA: Diagnosis not present

## 2017-09-07 MED ORDER — AZITHROMYCIN 250 MG PO TABS: ORAL_TABLET | ORAL | 0 refills | Status: DC

## 2017-09-07 MED ORDER — LEVOCETIRIZINE DIHYDROCHLORIDE 5 MG PO TABS: 5.0000 mg | ORAL_TABLET | Freq: Every evening | ORAL | 0 refills | Status: DC

## 2017-09-07 MED ORDER — METHYLPREDNISOLONE ACETATE 80 MG/ML IJ SUSP
80.0000 mg | Freq: Once | INTRAMUSCULAR | Status: AC
  Administered 2017-09-07: 80 mg via INTRAMUSCULAR

## 2017-09-07 NOTE — Patient Instructions (Signed)
Continue to push fluids, practice good hand hygiene, and cover your mouth if you cough.  If you start having fevers, shaking or shortness of breath, seek immediate care.  Claritin (loratadine), Allegra (fexofenadine), Zyrtec (cetirizine); these are listed in order from weakest to strongest. Generic, and therefore cheaper, options are in the parentheses.   Flonase (fluticasone); nasal spray that is over the counter. 2 sprays each nostril, once daily. Aim towards the same side eye when you spray.  There are available OTC, and the generic versions, which may be cheaper, are in parentheses. Show this to a pharmacist if you have trouble finding any of these items.  Try these before using the antibiotic (azithromycin). Wait 2-3 days before taking it and only take it if you are not feeling any better.  Let us know if you need anything.

## 2017-09-07 NOTE — Progress Notes (Signed)
Pre visit review using our clinic review tool, if applicable. No additional management support is needed unless otherwise documented below in the visit note. 

## 2017-09-07 NOTE — Progress Notes (Signed)
Chief Complaint  Patient presents with  . Cough    congestion    Stephana Hageman here for URI complaints.  Duration: 1 month  Associated symptoms: sinus congestion, rhinorrhea and cough Denies: sinus pain, itchy watery eyes, ear pain, ear drainage, sore throat, wheezing, shortness of breath, myalgia and fevers Treatment to date: None Sick contacts: Yes- daughter  ROS:  Const: Denies fevers HEENT: As noted in HPI Lungs: No SOB  Past Medical History:  Diagnosis Date  . Arthritis   . Pregnant   . Seizures (HCC)    Febrile seizures when young   History reviewed. No pertinent family history.  BP 110/68 (BP Location: Left Arm, Patient Position: Sitting, Cuff Size: Normal)   Pulse 74   Temp 98.1 F (36.7 C) (Oral)   Ht 5\' 10"  (1.778 m)   Wt 156 lb 4 oz (70.9 kg)   SpO2 98%   BMI 22.42 kg/m  General: Awake, alert, appears stated age HEENT: AT, [place], ears patent b/l and TM's neg, nares patent w/o discharge, pharynx pink and without exudates, MMM Neck: No masses or asymmetry Heart: RRR Lungs: CTAB, no accessory muscle use Psych: Age appropriate judgment and insight, normal mood and affect  Acute bacterial bronchitis - Plan: levocetirizine (XYZAL) 5 MG tablet, azithromycin (ZITHROMAX) 250 MG tablet, methylPREDNISolone acetate (DEPO-MEDROL) injection 80 mg  Orders as above. Tx allergies 1st with injection and Xyzal. Wait 2-3 days, if no improvement use abx. Continue to push fluids, practice good hand hygiene, cover mouth when coughing. F/u prn. If starting to experience fevers, shaking, or shortness of breath, seek immediate care. If no improvement, will consider stronger tx for allergies vs CXR. Pt voiced understanding and agreement to the plan.  Jilda Rocheicholas Paul [place], DO 09/07/17 10:17 AM

## 2017-10-13 DIAGNOSIS — O021 Missed abortion: Secondary | ICD-10-CM | POA: Diagnosis not present

## 2017-10-20 DIAGNOSIS — O021 Missed abortion: Secondary | ICD-10-CM | POA: Diagnosis not present

## 2017-11-05 DIAGNOSIS — O021 Missed abortion: Secondary | ICD-10-CM | POA: Diagnosis not present

## 2018-02-22 DIAGNOSIS — Z3201 Encounter for pregnancy test, result positive: Secondary | ICD-10-CM | POA: Diagnosis not present

## 2018-02-24 DIAGNOSIS — N911 Secondary amenorrhea: Secondary | ICD-10-CM | POA: Diagnosis not present

## 2018-02-24 DIAGNOSIS — O26891 Other specified pregnancy related conditions, first trimester: Secondary | ICD-10-CM | POA: Diagnosis not present

## 2018-02-24 DIAGNOSIS — Z3201 Encounter for pregnancy test, result positive: Secondary | ICD-10-CM | POA: Diagnosis not present

## 2018-03-04 DIAGNOSIS — O3680X0 Pregnancy with inconclusive fetal viability, not applicable or unspecified: Secondary | ICD-10-CM | POA: Diagnosis not present

## 2018-03-04 DIAGNOSIS — Z3A01 Less than 8 weeks gestation of pregnancy: Secondary | ICD-10-CM | POA: Diagnosis not present

## 2018-03-04 DIAGNOSIS — O2691 Pregnancy related conditions, unspecified, first trimester: Secondary | ICD-10-CM | POA: Diagnosis not present

## 2018-03-23 DIAGNOSIS — O021 Missed abortion: Secondary | ICD-10-CM | POA: Diagnosis not present

## 2018-03-23 DIAGNOSIS — Z3A01 Less than 8 weeks gestation of pregnancy: Secondary | ICD-10-CM | POA: Diagnosis not present

## 2018-03-23 DIAGNOSIS — O26891 Other specified pregnancy related conditions, first trimester: Secondary | ICD-10-CM | POA: Diagnosis not present

## 2018-04-01 DIAGNOSIS — O021 Missed abortion: Secondary | ICD-10-CM | POA: Diagnosis not present

## 2018-04-05 DIAGNOSIS — O021 Missed abortion: Secondary | ICD-10-CM | POA: Diagnosis not present

## 2018-04-15 DIAGNOSIS — O021 Missed abortion: Secondary | ICD-10-CM | POA: Diagnosis not present

## 2018-04-22 DIAGNOSIS — O021 Missed abortion: Secondary | ICD-10-CM | POA: Diagnosis not present

## 2018-04-29 DIAGNOSIS — O021 Missed abortion: Secondary | ICD-10-CM | POA: Diagnosis not present

## 2018-05-06 DIAGNOSIS — O021 Missed abortion: Secondary | ICD-10-CM | POA: Diagnosis not present

## 2018-05-13 DIAGNOSIS — O021 Missed abortion: Secondary | ICD-10-CM | POA: Diagnosis not present

## 2018-07-16 DIAGNOSIS — N96 Recurrent pregnancy loss: Secondary | ICD-10-CM | POA: Diagnosis not present

## 2018-07-19 DIAGNOSIS — N925 Other specified irregular menstruation: Secondary | ICD-10-CM | POA: Diagnosis not present

## 2018-07-27 DIAGNOSIS — Z8759 Personal history of other complications of pregnancy, childbirth and the puerperium: Secondary | ICD-10-CM | POA: Diagnosis not present

## 2018-07-27 DIAGNOSIS — Z3201 Encounter for pregnancy test, result positive: Secondary | ICD-10-CM | POA: Diagnosis not present

## 2018-07-27 DIAGNOSIS — N911 Secondary amenorrhea: Secondary | ICD-10-CM | POA: Diagnosis not present

## 2018-08-05 DIAGNOSIS — Z3A01 Less than 8 weeks gestation of pregnancy: Secondary | ICD-10-CM | POA: Diagnosis not present

## 2018-08-05 DIAGNOSIS — O09291 Supervision of pregnancy with other poor reproductive or obstetric history, first trimester: Secondary | ICD-10-CM | POA: Diagnosis not present

## 2019-04-11 ENCOUNTER — Encounter (HOSPITAL_COMMUNITY): Payer: Self-pay

## 2019-04-11 ENCOUNTER — Other Ambulatory Visit: Payer: Self-pay

## 2019-04-11 ENCOUNTER — Ambulatory Visit (HOSPITAL_COMMUNITY): Payer: 59 | Admitting: *Deleted

## 2019-04-11 ENCOUNTER — Ambulatory Visit (HOSPITAL_COMMUNITY): Payer: 59 | Attending: Obstetrics and Gynecology | Admitting: Obstetrics

## 2019-04-11 DIAGNOSIS — Z3169 Encounter for other general counseling and advice on procreation: Secondary | ICD-10-CM

## 2019-04-11 DIAGNOSIS — N96 Recurrent pregnancy loss: Secondary | ICD-10-CM | POA: Insufficient documentation

## 2019-04-11 NOTE — Progress Notes (Signed)
Pt in for MFM consult, BP 129/64, T 98.1, pulse 80.

## 2019-04-14 NOTE — Progress Notes (Signed)
MFM Consult Note  This patient was seen for a preconception consult due to a history of recurrent first trimester miscarriages.  She is a gravida 5 para 1-0-4-1.  She has a history of a full-term normal spontaneous vaginal delivery without any complications in 2017.  She then experienced recurrent miscarriages occurring at between 6 to 9 weeks with her last 4 pregnancies.  She reports that these miscarriages were all found during a first trimester ultrasound where an absent fetal heartbeat was noted.  She received Cytotec for the passage of these miscarriages at home.  The products of conception were therefore not sent for chromosome analysis.  Laboratory work performed for her recurrent miscarriages have shown a negative antinuclear antibody, normal Antithrombin activity, negative factor V Leiden gene mutation, normal protein S function, normal prolactin levels, normal anti-Mullerian hormone levels, negative IgM and IgG anticardiolipin antibody, normal LH, FSH and estradiol levels, and negative lupus anticoagulant.  The patient denies any significant past medical history.  She denies any history of autoimmune diseases.    She denies any history of a prior thromboembolic event.  She denies any prior surgeries.  Her current medications include prenatal vitamins.  The various causes of first trimester miscarriages including a random event, chromosomal issues, hormonal issues, structural uterine issues, and possible clotting issues were discussed with the patient. Ms Osgood reports that she will be scheduled for a sonohysterogram in your office soon to rule out any structural issues such as adhesions or fibroids in her uterus that may contribute to the first trimester miscarriages.  Should an intrauterine anomaly be found, the issue should be resolved prior to conceiving her next pregnancy to improve her chances of having a successful pregnancy outcome.   The patient was advised that both she and her  husband can have their karyotypes determined to rule out a chromosomal issue such as a balanced translocation which may contribute to recurrent first trimester miscarriages.  Our genetic counselor can help with ordering the karyotype analysis for the couple if necessary.  She was advised that should a balance translocation be found, that in vitro fertilization with preimplantation genetic diagnosis with the transfer of only normal embryos may be a treatment option.  Alternatively, by chance, people with a balanced translocation can have a chromosomally normal fetus.  The patient was advised that the antiphospholipid antibody (APA ) syndrome may also contribute to recurrent first trimester miscarriages.  As she has screened negative for the lupus anticoagulant and anticardiolipin antibodies, it is unlikely that she has the APA syndrome.  As she has not been screened for the anti-beta-2 glycoprotein 1 antibodies, the IgM, IgG, and IgA anti-beta-2 glycoprotein 1 antibodies should be ordered prior to conceiving her next pregnancy.   As the patient is seeking treatment to help her obtain a successful pregnancy outcome, I advised her that it may be reasonable to provide her with progesterone supplementation during the first trimester of her future pregnancy, as it probably will do no harm.  Although she does not meet the laboratory criteria for the antiphospholipid antibody syndrome, I advised the patient that she may start taking a daily baby aspirin prior to conceiving her next pregnancy and she may be started on prophylactic Lovenox (40 mg daily) once a pregnancy has been confirmed.  The patient understands that this treatment may be controversial, however I have used this type of treatment in pregnancy in women with her history with successful outcomes.  The daily baby aspirin may be continued for the duration  of her future pregnancy.  The prophylactic Lovenox may be discontinued at around 18 to 20 weeks once she  is safely out of the first trimester.  The patient understands that due to her history of recurrent miscarriages, that there is a possibility that despite all medical treatments/interventions, that she is may still not have a successful pregnancy outcome.  The patient stated that she understands this and is willing to try the recommended therapy to help her achieve a successful pregnancy outcome.    She was advised that once she is pregnant, we will be happy to follow her with ultrasounds in our office.  She should also be offered a screening test for fetal aneuploidy once she is pregnant.    At the end of the consultation, the patient stated that all her questions have been answered to her complete satisfaction.    Thank you for referring this very nice patient for Maternal-Fetal Medicine preconception consultation.  Recommendations: -Obtain IgM, IgG, IgA anti-beta-2 glycoprotein 1 antibodies -Consider obtaining karyotypes for both parents prior to conceiving her next pregnancy.  A genetic counseling appointment in our office may be arranged. -Take a daily baby aspirin (81 mg daily) for the duration of her future pregnancy -Consider progesterone supplementation and prophylactic Lovenox (40 mg  daily) during the first trimester of her future pregnancy

## 2019-11-04 DIAGNOSIS — Z23 Encounter for immunization: Secondary | ICD-10-CM | POA: Diagnosis not present

## 2019-11-15 ENCOUNTER — Telehealth (INDEPENDENT_AMBULATORY_CARE_PROVIDER_SITE_OTHER): Payer: Self-pay | Admitting: Medical

## 2019-11-15 ENCOUNTER — Other Ambulatory Visit: Payer: Self-pay

## 2019-11-15 VITALS — HR 77 | Temp 98.0°F

## 2019-11-15 DIAGNOSIS — J3489 Other specified disorders of nose and nasal sinuses: Secondary | ICD-10-CM

## 2019-11-15 DIAGNOSIS — J208 Acute bronchitis due to other specified organisms: Secondary | ICD-10-CM

## 2019-11-15 DIAGNOSIS — J309 Allergic rhinitis, unspecified: Secondary | ICD-10-CM

## 2019-11-15 DIAGNOSIS — B9689 Other specified bacterial agents as the cause of diseases classified elsewhere: Secondary | ICD-10-CM

## 2019-11-15 MED ORDER — FLUTICASONE PROPIONATE 50 MCG/ACT NA SUSP: 2.0000 | Freq: Every day | NASAL | 1 refills | Status: DC

## 2019-11-15 MED ORDER — LEVOCETIRIZINE DIHYDROCHLORIDE 5 MG PO TABS: 5.0000 mg | ORAL_TABLET | Freq: Every evening | ORAL | 1 refills | Status: DC

## 2019-11-15 MED ORDER — AZITHROMYCIN 250 MG PO TABS: ORAL_TABLET | ORAL | 0 refills | Status: DC

## 2019-11-15 NOTE — Progress Notes (Signed)
   Subjective:    Patient ID: Brittany Mccarthy, female    DOB: 05-Jan-1986, 34 y.o.   MRN: [id number]  HPI  Virtual Visit via Video Note  I connected with Brittany Mccarthy on 11/15/19 at  8:00 AM EDT by a video enabled telemedicine application and verified that I am speaking with the correct person using two identifiers.  Location: Patient: home [place]. Provider: office Participants- pt and myself.   I discussed the limitations of evaluation and management by telemedicine and the availability of in person appointments. The patient expressed understanding and agreed to proceed.  History of Present Illness:  Pt had sinus pressure for 10 days. Pt got covid vaccine around the same time. Initially thought was side effect of vaccine. Started with mild ha then went to frontal sinus pain and has some faint mild posterior neck pain. Some nasal congestion. No sneeizng and not mucus. No fever, no chills or sweats.   Normal sense of smell and taste.  lmp 10-25-2019- 10-30-2019.     Pt does have hx of seasonal allergies may to October.  Observations/Objective:  General-no acute distress, pleasant, oriented. Lungs- on inspection lungs appear unlabored. Neck- no tracheal deviation or jvd on inspection.  No neck stiffness. Neuro- gross motor function appears intact. HEENT-frontal sinus pressure on self palpation.   Assessment and Plan: Recent signs and symptoms likely from allergic rhinitis with possible sinus infection.  I want you to restart Xyzal antihistamine and Flonase nasal spray prescribed.  If frontal sinus pressure persist despite use of these medications then add on a azithromycin antibiotic.  On review of recent signs and symptoms I do not suspect Covid infection.  But if your symptoms worsen, change or persist please let us know.  Follow-up in 7 to 10 days or as needed.  Follow Up Instructions:    I discussed the assessment and treatment plan with the patient. The patient was provided  an opportunity to ask questions and all were answered. The patient agreed with the plan and demonstrated an understanding of the instructions.   The patient was advised to call back or seek an in-person evaluation if the symptoms worsen or if the condition fails to improve as anticipated.  I provided 20 minutes of non-face-to-face time during this encounter.   Esperanza Richters, PA-C  Review of Systems     Objective:   Physical Exam        Assessment & Plan:

## 2019-11-15 NOTE — Patient Instructions (Signed)
Recent signs and symptoms likely from allergic rhinitis with possible sinus infection.  I want you to restart Xyzal antihistamine and Flonase nasal spray prescribed.  If frontal sinus pressure persist despite use of these medications then add on a azithromycin antibiotic.  On review of recent signs and symptoms I do not suspect Covid infection.  But if your symptoms worsen, change or persist please let us know.  Follow-up in 7 to 10 days or as needed.

## 2019-12-06 DIAGNOSIS — Z6821 Body mass index (BMI) 21.0-21.9, adult: Secondary | ICD-10-CM | POA: Diagnosis not present

## 2019-12-06 DIAGNOSIS — Z130 Encounter for screening for diseases of the blood and blood-forming organs and certain disorders involving the immune mechanism: Secondary | ICD-10-CM | POA: Diagnosis not present

## 2019-12-06 DIAGNOSIS — Z01419 Encounter for gynecological examination (general) (routine) without abnormal findings: Secondary | ICD-10-CM | POA: Diagnosis not present

## 2019-12-06 DIAGNOSIS — Z1389 Encounter for screening for other disorder: Secondary | ICD-10-CM | POA: Diagnosis not present

## 2020-01-05 DIAGNOSIS — Z20822 Contact with and (suspected) exposure to covid-19: Secondary | ICD-10-CM | POA: Diagnosis not present

## 2020-01-31 DIAGNOSIS — Z3201 Encounter for pregnancy test, result positive: Secondary | ICD-10-CM | POA: Diagnosis not present

## 2020-02-02 DIAGNOSIS — N911 Secondary amenorrhea: Secondary | ICD-10-CM | POA: Diagnosis not present

## 2020-02-02 DIAGNOSIS — Z3202 Encounter for pregnancy test, result negative: Secondary | ICD-10-CM | POA: Diagnosis not present

## 2020-02-02 DIAGNOSIS — Z3201 Encounter for pregnancy test, result positive: Secondary | ICD-10-CM | POA: Diagnosis not present

## 2020-02-13 DIAGNOSIS — Z03818 Encounter for observation for suspected exposure to other biological agents ruled out: Secondary | ICD-10-CM | POA: Diagnosis not present

## 2020-02-16 DIAGNOSIS — Z20822 Contact with and (suspected) exposure to covid-19: Secondary | ICD-10-CM | POA: Diagnosis not present

## 2020-02-17 ENCOUNTER — Inpatient Hospital Stay (HOSPITAL_COMMUNITY): Payer: BC Managed Care – PPO

## 2020-02-17 ENCOUNTER — Inpatient Hospital Stay (HOSPITAL_COMMUNITY)
Admission: AD | Admit: 2020-02-17 | Discharge: 2020-02-17 | Disposition: A | Discharge status: Home / Self Care | Payer: BC Managed Care – PPO | Attending: Obstetrics and Gynecology | Admitting: Obstetrics and Gynecology

## 2020-02-17 ENCOUNTER — Other Ambulatory Visit: Payer: Self-pay

## 2020-02-17 ENCOUNTER — Encounter (HOSPITAL_COMMUNITY): Payer: Self-pay | Admitting: Obstetrics and Gynecology

## 2020-02-17 DIAGNOSIS — O26891 Other specified pregnancy related conditions, first trimester: Secondary | ICD-10-CM | POA: Insufficient documentation

## 2020-02-17 DIAGNOSIS — O4690 Antepartum hemorrhage, unspecified, unspecified trimester: Secondary | ICD-10-CM

## 2020-02-17 DIAGNOSIS — O209 Hemorrhage in early pregnancy, unspecified: Secondary | ICD-10-CM | POA: Diagnosis not present

## 2020-02-17 DIAGNOSIS — O99891 Other specified diseases and conditions complicating pregnancy: Secondary | ICD-10-CM | POA: Diagnosis not present

## 2020-02-17 DIAGNOSIS — R109 Unspecified abdominal pain: Secondary | ICD-10-CM | POA: Diagnosis not present

## 2020-02-17 DIAGNOSIS — Z791 Long term (current) use of non-steroidal anti-inflammatories (NSAID): Secondary | ICD-10-CM | POA: Insufficient documentation

## 2020-02-17 DIAGNOSIS — O3680X0 Pregnancy with inconclusive fetal viability, not applicable or unspecified: Secondary | ICD-10-CM | POA: Diagnosis not present

## 2020-02-17 DIAGNOSIS — O09521 Supervision of elderly multigravida, first trimester: Secondary | ICD-10-CM | POA: Insufficient documentation

## 2020-02-17 DIAGNOSIS — R9389 Abnormal findings on diagnostic imaging of other specified body structures: Secondary | ICD-10-CM | POA: Diagnosis not present

## 2020-02-17 DIAGNOSIS — Z79899 Other long term (current) drug therapy: Secondary | ICD-10-CM | POA: Diagnosis not present

## 2020-02-17 DIAGNOSIS — Z3A00 Weeks of gestation of pregnancy not specified: Secondary | ICD-10-CM | POA: Diagnosis not present

## 2020-02-17 DIAGNOSIS — N939 Abnormal uterine and vaginal bleeding, unspecified: Secondary | ICD-10-CM

## 2020-02-17 LAB — CBC
HCT: 46.6 % — ABNORMAL HIGH (ref 36.0–46.0)
Hemoglobin: 15.3 g/dL — ABNORMAL HIGH (ref 12.0–15.0)
MCH: 31.2 pg (ref 26.0–34.0)
MCHC: 32.8 g/dL (ref 30.0–36.0)
MCV: 95.1 fL (ref 80.0–100.0)
Platelets: 205 10*3/uL (ref 150–400)
RBC: 4.9 MIL/uL (ref 3.87–5.11)
RDW: 12.4 % (ref 11.5–15.5)
WBC: 10.1 10*3/uL (ref 4.0–10.5)
nRBC: 0 % (ref 0.0–0.2)

## 2020-02-17 LAB — URINALYSIS, ROUTINE W REFLEX MICROSCOPIC
Bilirubin Urine: NEGATIVE
Glucose, UA: NEGATIVE mg/dL
Ketones, ur: NEGATIVE mg/dL
Leukocytes,Ua: NEGATIVE
Nitrite: NEGATIVE
Protein, ur: NEGATIVE mg/dL
RBC / HPF: >50 RBC/hpf — ABNORMAL HIGH (ref 0–5)
Specific Gravity, Urine: 1.019 (ref 1.005–1.030)
pH: 5 (ref 5.0–8.0)

## 2020-02-17 LAB — HCG, QUANTITATIVE, PREGNANCY: hCG, Beta Chain, Quant, S: 309 m[IU]/mL — ABNORMAL HIGH (ref <5)

## 2020-02-17 NOTE — Discharge Instructions (Signed)
Threatened Miscarriage  A threatened miscarriage is when you have bleeding from your vagina during the first 20 weeks of pregnancy but the pregnancy does not end. Your doctor will do tests to make sure you are still pregnant. The cause of the bleeding may not be known. This condition does not mean your pregnancy will end, but it does increase the risk that it will end (complete miscarriage). Follow these instructions at home:  Get plenty of rest.  If you have bleeding in your vagina, do not have sex or use tampons.  Do not douche.  Do not smoke or use drugs.  Do not drink alcohol.  Avoid caffeine.  Keep all follow-up prenatal visits as told by your doctor. This is important. Contact a doctor if:  You have light bleeding from your vagina.  You have belly pain or cramping.  You have a fever. Get help right away if:  You have heavy bleeding from your vagina.  You have clots of blood coming from your vagina.  You pass tissue from your vagina.  You have a gush of fluid from your vagina.  You are leaking fluid from your vagina.  You have very bad pain or cramps in your low back or belly.  You have fever, chills, and very bad belly pain. Summary  A threatened miscarriage is when you have bleeding from your vagina during the first 20 weeks of pregnancy but the pregnancy does not end.  This condition does not mean your pregnancy will end, but it does increase the risk that it will end (complete miscarriage).  Get plenty of rest. If you have bleeding in your vagina, do not have sex or use tampons.  Keep all follow-up prenatal visits as told by your doctor. This is important. This information is not intended to replace advice given to you by your health care provider. Make sure you discuss any questions you have with your health care provider. Document Revised: 04/09/2017 Document Reviewed: 05/30/2016 Elsevier Patient Education  2020 [employer]  

## 2020-02-17 NOTE — MAU Provider Note (Signed)
History     CSN: [id number]  Arrival date and time: 02/17/20 1614  First Provider Initiated Contact with Patient 02/17/20 1643     Chief Complaint  Patient presents with  . Vaginal Bleeding  . Abdominal Pain   HPI Brittany Mccarthy is a 34 y.o. [phone] in early pregnancy who presents to MAU with chief concern for miscarriage in progress in the setting of history of multiple miscarriages. Patient reports recurrent heavy vaginal bleeding which began Tuesday 02/14/2020. Her bleeding has waxed and waned since onset including passing small and moderate clots on multiple occasions. She has not saturated a pad. She denies associated symptoms including dizziness, SOB, weakness, syncope.  Patient receives care with [facility]. She called with her symptoms but had a recent COVID exposure and was unable to achieve an appointment.  OB History    Gravida  5   Para  1   Term  1   Preterm      AB  3   Living  1     SAB  3   TAB      Ectopic      Multiple      Live Births  1           Past Medical History:  Diagnosis Date  . Arthritis   . Pregnant   . Seizures (HCC)    Febrile seizures when young    Past Surgical History:  Procedure Laterality Date  . EYE SURGERY    . HERNIA REPAIR    . WISDOM TOOTH EXTRACTION      No family history on file.  Social History   Tobacco Use  . Smoking status: Never Smoker  . Smokeless tobacco: Never Used  Vaping Use  . Vaping Use: Never used  Substance Use Topics  . Alcohol use: No    Alcohol/week: 0.0 standard drinks  . Drug use: Never    Allergies:  Allergies  Allergen Reactions  . Fish Allergy Anaphylaxis    Medications Prior to Admission  Medication Sig Dispense Refill Last Dose  . acetaminophen (TYLENOL) 500 MG tablet Take 1,000 mg by mouth every 6 (six) hours as needed for mild pain, moderate pain or headache.     Marland Kitchen azithromycin (ZITHROMAX) 250 MG tablet Take 2 tabs the first day and then 1 tab daily until you  run out. 6 tablet 0   . fluticasone (FLONASE) 50 MCG/ACT nasal spray Place 2 sprays into both nostrils daily. 16 g 1   . ibuprofen (ADVIL,MOTRIN) 600 MG tablet Take 600 mg by mouth every 6 (six) hours as needed for mild pain or moderate pain.     Marland Kitchen levocetirizine (XYZAL) 5 MG tablet Take 1 tablet (5 mg total) by mouth every evening. (Patient not taking: Reported on 04/11/2019) 30 tablet 0   . levocetirizine (XYZAL) 5 MG tablet Take 1 tablet (5 mg total) by mouth every evening. 90 tablet 1   . Prenatal Vit-Fe Fumarate-FA (PRENATAL MULTIVITAMIN) TABS tablet Take 1 tablet by mouth at bedtime.     . triamcinolone cream (KENALOG) 0.1 % Apply 1 application topically 2 (two) times daily. (Patient not taking: Reported on 04/11/2019) 30 g 0     Review of Systems  Genitourinary: Positive for vaginal bleeding.  All other systems reviewed and are negative.  Physical Exam   Blood pressure 121/67, pulse (!) 106, temperature 98.5 F (36.9 C), temperature source Oral, resp. rate 16, height 5\' 10"  (1.778 m), weight 68.9 kg, last menstrual  period 12/24/2019, SpO2 99 %.  Physical Exam Vitals and nursing note reviewed. Exam conducted with a chaperone present.  Constitutional:      General: She is not in acute distress.    Appearance: She is well-developed. She is not ill-appearing.  Cardiovascular:     Rate and Rhythm: Normal rate.     Heart sounds: Normal heart sounds.  Pulmonary:     Effort: Pulmonary effort is normal.     Breath sounds: Normal breath sounds.  Skin:    General: Skin is warm and dry.     Capillary Refill: Capillary refill takes less than 2 seconds.  Neurological:     Mental Status: She is alert.  Psychiatric:        Mood and Affect: Mood normal.        Behavior: Behavior normal.     MAU Course  Procedures  --Records from mid-Nov visit visible in media tab. Quant hCG 81 on 01/31/2020. No recent followup  Orders Placed This Encounter  Procedures  . US OB LESS THAN 14 WEEKS  WITH OB TRANSVAGINAL  . Urinalysis, Routine w reflex microscopic Urine, Clean Catch  . CBC  . hCG, quantitative, pregnancy   Patient Vitals for the past 24 hrs:  BP Temp Temp src Pulse Resp SpO2 Height Weight  02/17/20 1634 121/67 98.5 F (36.9 C) Oral (!) 106 16 99 % 5\' 10"  (1.778 m) 68.9 kg   Results for orders placed or performed during the hospital encounter of 02/17/20 (from the past 24 hour(s))  CBC     Status: Abnormal   Collection Time: 02/17/20  4:44 PM  Result Value Ref Range   WBC 10.1 4.0 - 10.5 K/uL   RBC 4.90 3.87 - 5.11 MIL/uL   Hemoglobin 15.3 (H) 12.0 - 15.0 g/dL   HCT 14/03/21 (H) 36 - 46 %   MCV 95.1 80.0 - 100.0 fL   MCH 31.2 26.0 - 34.0 pg   MCHC 32.8 30.0 - 36.0 g/dL   RDW 19.5 09.3 - 26.7 %   Platelets 205 150 - 400 K/uL   nRBC 0.0 0.0 - 0.2 %  hCG, quantitative, pregnancy     Status: Abnormal   Collection Time: 02/17/20  4:44 PM  Result Value Ref Range   hCG, Beta Chain, Quant, S 309 (H) <5 mIU/mL  Urinalysis, Routine w reflex microscopic Urine, Clean Catch     Status: Abnormal   Collection Time: 02/17/20  4:49 PM  Result Value Ref Range   Color, Urine YELLOW YELLOW   APPearance CLEAR CLEAR   Specific Gravity, Urine 1.019 1.005 - 1.030   pH 5.0 5.0 - 8.0   Glucose, UA NEGATIVE NEGATIVE mg/dL   Hgb urine dipstick LARGE (A) NEGATIVE   Bilirubin Urine NEGATIVE NEGATIVE   Ketones, ur NEGATIVE NEGATIVE mg/dL   Protein, ur NEGATIVE NEGATIVE mg/dL   Nitrite NEGATIVE NEGATIVE   Leukocytes,Ua NEGATIVE NEGATIVE   RBC / HPF >50 (H) 0 - 5 RBC/hpf   WBC, UA 0-5 0 - 5 WBC/hpf   Bacteria, UA RARE (A) NONE SEEN   Squamous Epithelial / LPF 0-5 0 - 5   Mucus PRESENT    14/03/21 OB LESS THAN 14 WEEKS WITH OB TRANSVAGINAL  Result Date: 02/17/2020 CLINICAL DATA:  Heavy bleeding 3 days EXAM: OBSTETRIC <14 WK 14/05/2019 AND TRANSVAGINAL OB US TECHNIQUE: Both transabdominal and transvaginal ultrasound examinations were performed for complete evaluation of the gestation as well as the  maternal uterus, adnexal regions, and pelvic  cul-de-sac. Transvaginal technique was performed to assess early pregnancy. COMPARISON:  None. FINDINGS: Intrauterine gestational sac: None Yolk sac:  Not Visualized. Subchorionic hemorrhage:  None visualized. Maternal uterus/adnexae: Tiny 5 mm echogenic foci seen within the right ovary which may represent a hemorrhagic cyst. The left ovary is normal in appearance. There is headache heterogeneous thickened endometrium measuring 8.8 mm. Small amount of free fluid seen the cul-de-sac. IMPRESSION: Given the history of a positive pregnancy test, differential considerations include intrauterine gestation too early to visualize, completed abortion, or non-visualized ectopic pregnancy. Close clinical correlation is recommended with serial beta-hCG and followup ultrasound as warranted. Electronically Signed   By: Jonna Clark M.D.   On: 02/17/2020 19:00   Assessment and Plan  --34 y.o. [id number] with pregnancy of unknown location --Concern for miscarriage --Quant hCG 309 --Blood type A POS --Discharge home in stable condition with bleeding precautions  F/U: --Repeat Quant hCG at [facility] Monday 02/20/2020 --Voicemail left for office, note forwarded per patient   Calvert Cantor, CNM 02/17/2020, 8:04 PM

## 2020-02-17 NOTE — MAU Note (Signed)
Should be 7 wks preg tomorrow.  On Tues, started cramping and bleeding.   Was waiting for a neg covid test post exp before the OB would see her. Bleeding has gotten heavier, has been passing clots. Has been to office before exposure for confirmation of preg.  Has had 3 miscarriages

## 2020-02-20 DIAGNOSIS — Z3201 Encounter for pregnancy test, result positive: Secondary | ICD-10-CM | POA: Diagnosis not present

## 2020-02-21 DIAGNOSIS — Z20822 Contact with and (suspected) exposure to covid-19: Secondary | ICD-10-CM | POA: Diagnosis not present

## 2020-02-21 DIAGNOSIS — Z03818 Encounter for observation for suspected exposure to other biological agents ruled out: Secondary | ICD-10-CM | POA: Diagnosis not present

## 2020-02-27 DIAGNOSIS — O021 Missed abortion: Secondary | ICD-10-CM | POA: Diagnosis not present

## 2020-02-28 ENCOUNTER — Encounter: Payer: Self-pay | Admitting: Gastroenterology

## 2020-03-06 ENCOUNTER — Other Ambulatory Visit: Payer: BC Managed Care – PPO

## 2020-03-06 ENCOUNTER — Encounter: Payer: Self-pay | Admitting: Gastroenterology

## 2020-03-06 ENCOUNTER — Ambulatory Visit: Payer: BC Managed Care – PPO | Admitting: Gastroenterology

## 2020-03-06 VITALS — BP 100/60 | HR 81 | Ht 70.0 in | Wt 150.4 lb

## 2020-03-06 DIAGNOSIS — R1084 Generalized abdominal pain: Secondary | ICD-10-CM | POA: Diagnosis not present

## 2020-03-06 LAB — HCG, SERUM, QUALITATIVE: Preg, Serum: NEGATIVE

## 2020-03-06 NOTE — Progress Notes (Addendum)
[facility] Consult Note:  History: Brittany Mccarthy 03/06/2020  Referring provider: Pryor Ochoa, MD (gynecology)  Reason for consult/chief complaint: Abdominal Pain (Having periumbilical abdominal pain-sharp in nature-almost like with a "good ab workout"- pain is not associated with eating etc; also notes some nausea, no vomiting; notes that she has had several recent miscarriages but her levels went down quickly and pain has remained; does not feel that she has any issues with gas or constipation-she has tried gas-x to be sure and this was unhelpful. + loss of appetite)   Subjective  HPI:  This is a very pleasant 34 year old woman referred by her gynecologist for abdominal pain after a recent miscarriage. Brittany Mccarthy had nausea and some periumbilic abdominal discomfort in mid to late November, then found to be pregnant with a positive hCG.  She then had more severe cramps and developed vaginal bleeding as well as a Covid exposure over the Thanksgiving holiday, and on December 3 at an overnight stay at the [facility] hospital for miscarriage (reportedly her fifth in 3 years). She has not been seen in gynecology office since that hospital stay, but has had follow-up hCG levels reportedly normalizing.  She was concerned because she still has fairly generalized dull constant abdominal discomfort with intermittent nausea and loss of appetite.  She denies vomiting, dysphagia, constipation, diarrhea or rectal bleeding.  Brittany Mccarthy was concerned because she had not felt unwell for this long after previous miscarriages, even on occasions when it took months for her hCG to normalize.  She was thus referred to [place] by her gynecologist. Brittany Mccarthy tells me that work-up for clotting disorder was negative, and despite that she was on aspirin for some period of time.  Brittany Mccarthy became concerned with this ongoing pain because her father apparently had abdominal pain last summer and did not seek medical attention  and ultimately was found to have a perforation of some kind.  ROS:  Review of Systems  Constitutional: Negative for appetite change and unexpected weight change.  HENT: Negative for mouth sores and voice change.   Eyes: Negative for pain and redness.  Respiratory: Negative for cough and shortness of breath.   Cardiovascular: Negative for chest pain and palpitations.  Genitourinary: Negative for dysuria and hematuria.  Musculoskeletal: Negative for arthralgias and myalgias.  Skin: Negative for pallor and rash.  Neurological: Negative for weakness and headaches.  Hematological: Negative for adenopathy.     Past Medical History: Past Medical History:  Diagnosis Date  . Arthritis   . Kidney stones   . Seizures (HCC)    Febrile seizures when young     Past Surgical History: Past Surgical History:  Procedure Laterality Date  . EYE SURGERY Bilateral    x 2  . HERNIA REPAIR    . WISDOM TOOTH EXTRACTION       Family History: Family History  Problem Relation Age of Onset  . Colon cancer Neg Hx   . Esophageal cancer Neg Hx   . Pancreatic cancer Neg Hx   . Stomach cancer Neg Hx   . Liver disease Neg Hx     Social History: Social History   Socioeconomic History  . Marital status: Married    Spouse name: Not on file  . Number of children: Not on file  . Years of education: Not on file  . Highest education level: Not on file  Occupational History  . Not on file  Tobacco Use  . Smoking status: Never Smoker  . Smokeless tobacco: Never  Used  Vaping Use  . Vaping Use: Never used  Substance and Sexual Activity  . Alcohol use: No    Alcohol/week: 0.0 standard drinks  . Drug use: Never  . Sexual activity: Yes    Birth control/protection: None  Other Topics Concern  . Not on file  Social History Narrative  . Not on file   Social Determinants of Health   Financial Resource Strain: Not on file  Food Insecurity: Not on file  Transportation Needs: Not on file   Physical Activity: Not on file  Stress: Not on file  Social Connections: Not on file    Allergies: Allergies  Allergen Reactions  . Fish Allergy Anaphylaxis    Outpatient Meds: No current outpatient medications on file.   No current facility-administered medications for this visit.      ___________________________________________________________________ Objective   Exam:  BP 100/60   Pulse 81   Ht 5\' 10"  (1.778 m)   Wt 150 lb 6.4 oz (68.2 kg)   LMP 12/24/2019   SpO2 99%   BMI 21.58 kg/m    General: Well-appearing  Eyes: sclera anicteric, no redness  ENT: oral mucosa moist without lesions, no cervical or supraclavicular lymphadenopathy  CV: RRR without murmur, S1/S2, no JVD, no peripheral edema  Resp: clear to auscultation bilaterally, normal RR and effort noted  GI: soft, mild generalized tenderness, with active bowel sounds. No guarding or palpable organomegaly noted.  No distention  Skin; warm and dry, no rash or jaundice noted  Neuro: awake, alert and oriented x 3. Normal gross motor function and fluent speech  Labs:  CBC Latest Ref Rng & Units 02/17/2020 12/22/2015 12/08/2015  WBC 4.0 - 10.5 K/uL 10.1 14.3(H) 19.9(H)  Hemoglobin 12.0 - 15.0 g/dL 15.3(H) 15.0 12.8  Hematocrit 36.0 - 46.0 % 46.6(H) 42.8 36.0  Platelets 150 - 400 K/uL 205 216 145(L)   Hcg 309 on 02/17/20   Recent U/A with blood  Radiologic Studies:  CLINICAL DATA:  Heavy bleeding 3 days   EXAM: OBSTETRIC <14 WK 14/3/21 AND TRANSVAGINAL OB US   TECHNIQUE: Both transabdominal and transvaginal ultrasound examinations were performed for complete evaluation of the gestation as well as the maternal uterus, adnexal regions, and pelvic cul-de-sac. Transvaginal technique was performed to assess early pregnancy.   COMPARISON:  None.   FINDINGS: Intrauterine gestational sac: None   Yolk sac:  Not Visualized.   Subchorionic hemorrhage:  None visualized.   Maternal uterus/adnexae:  Tiny 5 mm echogenic foci seen within the right ovary which may represent a hemorrhagic cyst. The left ovary is normal in appearance. There is headache heterogeneous thickened endometrium measuring 8.8 mm. Small amount of free fluid seen the cul-de-sac.   IMPRESSION: Given the history of a positive pregnancy test, differential considerations include intrauterine gestation too early to visualize, completed abortion, or non-visualized ectopic pregnancy. Close clinical correlation is recommended with serial beta-hCG and followup ultrasound as warranted.     Electronically Signed   By: [place] M.D.   On: 02/17/2020 19:00   Assessment: Encounter Diagnosis  Name Primary?  . Generalized abdominal pain Yes   Persistent generalized abdominal discomfort since recent miscarriage.  Given the timing, this seems most likely to be somehow related to aftereffects of the miscarriage rather than a new onset primary digestive condition.  I am not certain what sort of complications could occur after an early intrauterine pregnancy, so I called her gynecologist office and spoke with the available physician (Dr. 14/05/2019), who kindly gave  me the results of recent lab work and discussed the case.  Lelani's hCG reportedly normalized with a level of 4 on December 13.  Dr. Senaida Ores felt it was unlikely that intra-abdominal complications would have occurred after this episode.   Plan:  Her gynecologist office will fax over the recent hCG levels to [place]  Blood hCG level today  CT abdomen and pelvis with oral and IV contrast.  This would be a more detailed and helpful abdominal/pelvic imaging study then ultrasound, specifically look for any areas of intestinal inflammation. If normal, I believe this will be of reassurance and the patient could be managed with symptomatic treatment and observation.  She did not feel she needed any medicines to treat nausea, as she has been taking some natural ginger  treatments that work well.  Thank you for the courtesy of this consult.  Please call me with any questions or concerns.  Charlie Pitter III  CC: Referring provider noted above  Record review addendum same day as clinic visit  From gynecology office, hCG normal at four on 02/27/2020 hCG elevated at 36 on 02/20/2020 (within a few days after the miscarriage) hCG elevated at 178 on 02/02/2020 (when pregnancy was discovered)  H. Myrtie Neither, MD

## 2020-03-06 NOTE — Patient Instructions (Addendum)
If you are age 35 or younger, your body mass index should be between 19-25. Your Body mass index is 21.58 kg/m. If this is out of the aformentioned range listed, please consider follow up with your Primary Care Provider.   Your provider has requested that you go to the basement level for lab work before leaving today. Press "B" on the elevator. The lab is located at the first door on the left as you exit the elevator.  Due to recent changes in healthcare laws, you may see the results of your imaging and laboratory studies on MyChart before your provider has had a chance to review them.  We understand that in some cases there may be results that are confusing or concerning to you. Not all laboratory results come back in the same time frame and the provider may be waiting for multiple results in order to interpret others.  Please give [place] 48 hours in order for your provider to thoroughly review all the results before contacting the office for clarification of your results.   You have been scheduled for a CT scan of the abdomen and pelvis at [facility], 1st floor Radiology. You are scheduled on 03-15-20 at 8:30am . You should arrive 15 minutes prior to your appointment time for registration.  Please pick up 2 bottles of contrast from [place] at least 3 days prior to your scan. The solution may taste better if refrigerated, but do NOT add ice or any other liquid to this solution. Shake well before drinking.   Please follow the written instructions below on the day of your exam:   1) Do not eat anything after 4:30am(4 hours prior to your test)   2) Drink 1 bottle of contrast @ 6:30am (2 hours prior to your exam)  Remember to shake well before drinking and do NOT pour over ice.     Drink 1 bottle of contrast @ 7:30am (1 hour prior to your exam)   You may take any medications as prescribed with a small amount of water, if necessary. If you take any of the following medications: METFORMIN,  GLUCOPHAGE, GLUCOVANCE, AVANDAMET, RIOMET, FORTAMET, [place] MET, JANUMET, GLUMETZA or METAGLIP, you MAY be asked to HOLD this medication 48 hours AFTER the exam.   The purpose of you drinking the oral contrast is to aid in the visualization of your intestinal tract. The contrast solution may cause some diarrhea. Depending on your individual set of symptoms, you may also receive an intravenous injection of x-ray contrast/dye. Plan on being at [facility] for 45 minutes or longer, depending on the type of exam you are having performed.   If you have any questions regarding your exam or if you need to reschedule, you may call [facility] at [phone] between the hours of 8:00 am and 5:00 pm, Monday-Friday.    Thank you for entrusting me with your care and choosing [facility].  Dr Ardis Hughs

## 2020-03-15 ENCOUNTER — Ambulatory Visit (HOSPITAL_COMMUNITY)
Admission: RE | Admit: 2020-03-15 | Discharge: 2020-03-15 | Disposition: A | Discharge status: Home / Self Care | Payer: BC Managed Care – PPO | Source: Ambulatory Visit | Attending: Gastroenterology | Admitting: Gastroenterology

## 2020-03-15 ENCOUNTER — Encounter (HOSPITAL_COMMUNITY): Payer: Self-pay

## 2020-03-15 ENCOUNTER — Other Ambulatory Visit: Payer: Self-pay

## 2020-03-15 DIAGNOSIS — R109 Unspecified abdominal pain: Secondary | ICD-10-CM | POA: Diagnosis not present

## 2020-03-15 DIAGNOSIS — R1084 Generalized abdominal pain: Secondary | ICD-10-CM | POA: Diagnosis not present

## 2020-03-15 MED ORDER — IOHEXOL 300 MG/ML  SOLN
100.0000 mL | Freq: Once | INTRAMUSCULAR | Status: AC | PRN
  Administered 2020-03-15: 100 mL via INTRAVENOUS

## 2020-03-17 NOTE — L&D Delivery Note (Signed)
Delivery Note Brittany Mccarthy is a [id number] at [redacted]w[redacted]d who had a spontaneous delivery at 1729 a viable female was delivered via  LOA.  APGAR: 7 , 9; weight  4490g (9lb14.4oz) .     Admitted for term IOL. Was admitted in early labor. Labor augmented with pitocin and AROM. Progressed normally. Pushed for 5 minutes. <20 second shoulder dystocia relieved with McRoberts and suprapubic pressure. No nuchal cord.  Delayed cord clamping for 60 seconds. Delivery of placenta was spontaneous. Placenta was found to be intact, 3 -vessel cord was noted. The fundus was found to be firm. 1st degree perineal laceration was repaired in the normal sterile fashion with 3-0 vicryl after infiltration with 1% lidocaine.  Estimated blood loss 150 cc. Instrument and gauze counts were correct at the end of the procedure.   Placenta status: to L&D for disposal  Anesthesia: local anesthesia for repair only Episiotomy:  none Lacerations:  1st degree perineal Suture Repair: 3.0 vicryl Est. Blood Loss (mL):   Mom to postpartum.  Baby to Couplet care / Skin to Skin.  Charlett Nose 01/16/2021, 8:13 PM

## 2020-04-30 DIAGNOSIS — Z3201 Encounter for pregnancy test, result positive: Secondary | ICD-10-CM | POA: Diagnosis not present

## 2020-05-02 DIAGNOSIS — Z3201 Encounter for pregnancy test, result positive: Secondary | ICD-10-CM | POA: Diagnosis not present

## 2020-05-09 DIAGNOSIS — Z8759 Personal history of other complications of pregnancy, childbirth and the puerperium: Secondary | ICD-10-CM | POA: Diagnosis not present

## 2020-05-24 DIAGNOSIS — Z113 Encounter for screening for infections with a predominantly sexual mode of transmission: Secondary | ICD-10-CM | POA: Diagnosis not present

## 2020-05-24 DIAGNOSIS — Z363 Encounter for antenatal screening for malformations: Secondary | ICD-10-CM | POA: Diagnosis not present

## 2020-05-24 DIAGNOSIS — Z3689 Encounter for other specified antenatal screening: Secondary | ICD-10-CM | POA: Diagnosis not present

## 2020-05-24 DIAGNOSIS — Z3A08 8 weeks gestation of pregnancy: Secondary | ICD-10-CM | POA: Diagnosis not present

## 2020-05-24 DIAGNOSIS — O26891 Other specified pregnancy related conditions, first trimester: Secondary | ICD-10-CM | POA: Diagnosis not present

## 2020-05-24 DIAGNOSIS — N96 Recurrent pregnancy loss: Secondary | ICD-10-CM | POA: Diagnosis not present

## 2020-05-24 LAB — OB RESULTS CONSOLE GC/CHLAMYDIA
Chlamydia: NEGATIVE
Gonorrhea: NEGATIVE

## 2020-05-24 LAB — OB RESULTS CONSOLE ABO/RH: RH Type: POSITIVE

## 2020-05-24 LAB — OB RESULTS CONSOLE RUBELLA ANTIBODY, IGM: Rubella: IMMUNE

## 2020-05-24 LAB — OB RESULTS CONSOLE HEPATITIS B SURFACE ANTIGEN: Hepatitis B Surface Ag: NEGATIVE

## 2020-05-24 LAB — OB RESULTS CONSOLE VARICELLA ZOSTER ANTIBODY, IGG: Varicella: IMMUNE

## 2020-05-24 LAB — OB RESULTS CONSOLE HIV ANTIBODY (ROUTINE TESTING): HIV: NONREACTIVE

## 2020-05-24 LAB — OB RESULTS CONSOLE ANTIBODY SCREEN: Antibody Screen: NEGATIVE

## 2020-05-24 LAB — OB RESULTS CONSOLE RPR: RPR: NONREACTIVE

## 2020-07-02 DIAGNOSIS — O09521 Supervision of elderly multigravida, first trimester: Secondary | ICD-10-CM | POA: Diagnosis not present

## 2020-07-02 DIAGNOSIS — O09522 Supervision of elderly multigravida, second trimester: Secondary | ICD-10-CM | POA: Diagnosis not present

## 2020-08-16 DIAGNOSIS — O09519 Supervision of elderly primigravida, unspecified trimester: Secondary | ICD-10-CM | POA: Diagnosis not present

## 2020-08-16 DIAGNOSIS — Z3A19 19 weeks gestation of pregnancy: Secondary | ICD-10-CM | POA: Diagnosis not present

## 2020-08-16 DIAGNOSIS — Z363 Encounter for antenatal screening for malformations: Secondary | ICD-10-CM | POA: Diagnosis not present

## 2020-10-11 DIAGNOSIS — Z3689 Encounter for other specified antenatal screening: Secondary | ICD-10-CM | POA: Diagnosis not present

## 2020-10-11 DIAGNOSIS — Z23 Encounter for immunization: Secondary | ICD-10-CM | POA: Diagnosis not present

## 2020-10-23 DIAGNOSIS — Z3402 Encounter for supervision of normal first pregnancy, second trimester: Secondary | ICD-10-CM | POA: Diagnosis not present

## 2020-10-23 DIAGNOSIS — Z3A22 22 weeks gestation of pregnancy: Secondary | ICD-10-CM | POA: Diagnosis not present

## 2020-10-30 DIAGNOSIS — R609 Edema, unspecified: Secondary | ICD-10-CM | POA: Diagnosis not present

## 2020-11-13 DIAGNOSIS — N898 Other specified noninflammatory disorders of vagina: Secondary | ICD-10-CM | POA: Diagnosis not present

## 2020-11-16 DIAGNOSIS — O09519 Supervision of elderly primigravida, unspecified trimester: Secondary | ICD-10-CM | POA: Diagnosis not present

## 2020-11-16 DIAGNOSIS — Z3A32 32 weeks gestation of pregnancy: Secondary | ICD-10-CM | POA: Diagnosis not present

## 2020-11-23 DIAGNOSIS — Z3A33 33 weeks gestation of pregnancy: Secondary | ICD-10-CM | POA: Diagnosis not present

## 2020-11-23 DIAGNOSIS — O09519 Supervision of elderly primigravida, unspecified trimester: Secondary | ICD-10-CM | POA: Diagnosis not present

## 2020-11-30 DIAGNOSIS — O09519 Supervision of elderly primigravida, unspecified trimester: Secondary | ICD-10-CM | POA: Diagnosis not present

## 2020-11-30 DIAGNOSIS — Z3A34 34 weeks gestation of pregnancy: Secondary | ICD-10-CM | POA: Diagnosis not present

## 2020-12-06 DIAGNOSIS — O09519 Supervision of elderly primigravida, unspecified trimester: Secondary | ICD-10-CM | POA: Diagnosis not present

## 2020-12-06 DIAGNOSIS — Z23 Encounter for immunization: Secondary | ICD-10-CM | POA: Diagnosis not present

## 2020-12-06 DIAGNOSIS — Z3A35 35 weeks gestation of pregnancy: Secondary | ICD-10-CM | POA: Diagnosis not present

## 2020-12-13 DIAGNOSIS — Z3A36 36 weeks gestation of pregnancy: Secondary | ICD-10-CM | POA: Diagnosis not present

## 2020-12-13 DIAGNOSIS — O09519 Supervision of elderly primigravida, unspecified trimester: Secondary | ICD-10-CM | POA: Diagnosis not present

## 2020-12-13 DIAGNOSIS — N898 Other specified noninflammatory disorders of vagina: Secondary | ICD-10-CM | POA: Diagnosis not present

## 2020-12-13 DIAGNOSIS — Z3685 Encounter for antenatal screening for Streptococcus B: Secondary | ICD-10-CM | POA: Diagnosis not present

## 2020-12-13 LAB — OB RESULTS CONSOLE GBS: GBS: POSITIVE

## 2020-12-20 DIAGNOSIS — Z3A37 37 weeks gestation of pregnancy: Secondary | ICD-10-CM | POA: Diagnosis not present

## 2020-12-20 DIAGNOSIS — O3663X0 Maternal care for excessive fetal growth, third trimester, not applicable or unspecified: Secondary | ICD-10-CM | POA: Diagnosis not present

## 2020-12-20 DIAGNOSIS — O09519 Supervision of elderly primigravida, unspecified trimester: Secondary | ICD-10-CM | POA: Diagnosis not present

## 2020-12-28 DIAGNOSIS — O09519 Supervision of elderly primigravida, unspecified trimester: Secondary | ICD-10-CM | POA: Diagnosis not present

## 2020-12-28 DIAGNOSIS — Z3A38 38 weeks gestation of pregnancy: Secondary | ICD-10-CM | POA: Diagnosis not present

## 2021-01-01 DIAGNOSIS — Z3A38 38 weeks gestation of pregnancy: Secondary | ICD-10-CM | POA: Diagnosis not present

## 2021-01-01 DIAGNOSIS — O09519 Supervision of elderly primigravida, unspecified trimester: Secondary | ICD-10-CM | POA: Diagnosis not present

## 2021-01-07 DIAGNOSIS — Z3A39 39 weeks gestation of pregnancy: Secondary | ICD-10-CM | POA: Diagnosis not present

## 2021-01-07 DIAGNOSIS — O09519 Supervision of elderly primigravida, unspecified trimester: Secondary | ICD-10-CM | POA: Diagnosis not present

## 2021-01-10 ENCOUNTER — Telehealth (HOSPITAL_COMMUNITY): Payer: Self-pay | Admitting: *Deleted

## 2021-01-10 ENCOUNTER — Inpatient Hospital Stay (HOSPITAL_COMMUNITY): Admit: 2021-01-10 | Payer: Self-pay

## 2021-01-10 ENCOUNTER — Encounter (HOSPITAL_COMMUNITY): Payer: Self-pay

## 2021-01-10 ENCOUNTER — Encounter (HOSPITAL_COMMUNITY): Payer: Self-pay | Admitting: *Deleted

## 2021-01-10 NOTE — Telephone Encounter (Signed)
Preadmission screen  

## 2021-01-14 ENCOUNTER — Other Ambulatory Visit: Payer: Self-pay | Admitting: Obstetrics and Gynecology

## 2021-01-14 ENCOUNTER — Encounter (HOSPITAL_COMMUNITY): Payer: Self-pay | Admitting: Obstetrics and Gynecology

## 2021-01-14 DIAGNOSIS — O09519 Supervision of elderly primigravida, unspecified trimester: Secondary | ICD-10-CM | POA: Diagnosis not present

## 2021-01-14 DIAGNOSIS — Z3A40 40 weeks gestation of pregnancy: Secondary | ICD-10-CM | POA: Diagnosis not present

## 2021-01-14 DIAGNOSIS — O480 Post-term pregnancy: Secondary | ICD-10-CM | POA: Diagnosis not present

## 2021-01-14 NOTE — H&P (Signed)
Brittany Mccarthy is a 35 y.o. female G5 P1031 at 13 5/7 weeks (EDD 01/10/21 by 7 week [place] inconsistent with LMP) presenting for IOL at term.  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 PV prog supp 3)  Group B Strep Carrier  NKDA 4) Hx of LGA baby  with G1 9#8oz, mild shoulder dystocia  OB History     Gravida  5   Para  1   Term  1   Preterm      AB  3   Living  1      SAB  3   IAB      Ectopic      Multiple      Live Births  1         12-07-2015, 41 wks M, 9lbs 8oz, Vaginal Delivery  SAB x 3  Past Medical History:  Diagnosis Date   Arthritis    GERD (gastroesophageal reflux disease)    Kidney stones    Seizures (HCC)    Febrile seizures when young   Past Surgical History:  Procedure Laterality Date   EYE SURGERY Bilateral    x 2   HERNIA REPAIR     WISDOM TOOTH EXTRACTION     Family History: family history is not on file. Social History:  reports that she has never smoked. She has never used smokeless tobacco. She reports that she does not drink alcohol and does not use drugs.     Maternal Diabetes: No Genetic Screening: Normal Maternal Ultrasounds/Referrals: Normal Fetal Ultrasounds or other Referrals:  None Maternal Substance Abuse:  No Significant Maternal Medications:  None Significant Maternal Lab Results:  Group B Strep positive Other Comments:  None  Review of Systems  Constitutional:  Negative for fever.  Gastrointestinal:  Negative for abdominal pain.  Genitourinary:  Negative for vaginal bleeding.  Maternal Medical History:  Contractions: Frequency: irregular.   Perceived severity is mild.   Fetal activity: Perceived fetal activity is normal.   Prenatal complications: AMA, hx of LGA baby, Recurrent miscarriage Prenatal Complications - Diabetes: none.    Last menstrual period 12/24/2019. Maternal Exam:   Uterine Assessment: Contraction strength is mild.  Contraction frequency is irregular.  Abdomen: Patient reports no abdominal tenderness. Fetal presentation: vertex Introitus: Normal vulva. Normal vagina.  Pelvis: adequate for delivery.    Physical Exam Cardiovascular:     Rate and Rhythm: Normal rate and regular rhythm.  Pulmonary:     Effort: Pulmonary effort is normal.  Abdominal:     Palpations: Abdomen is soft.  Genitourinary:    General: Normal vulva.  Neurological:     General: No focal deficit present.     Mental Status: She is alert.  Psychiatric:        Mood and Affect: Mood normal.    Prenatal labs: ABO, Rh: A/Positive/-- (03/10 0000) Antibody: Negative (03/10 0000) Rubella: Immune (03/10 0000) RPR: Nonreactive (03/10 0000)  HBsAg: Negative (03/10 0000)  HIV: Non-reactive (03/10 0000)  GBS: Positive/-- (09/29 0000)  Hgb AA NIPT low risk One hour GCT 94  Assessment/Plan: Pt for IOL at term and unfavorable cervix.  Plan pitocin ripening and possible foley bulb.  Pelvis test to 9#8oz, this baby feels a similar size. PCN for + GBS    Oliver Pila 01/14/2021, 8:15 PM

## 2021-01-15 ENCOUNTER — Inpatient Hospital Stay (HOSPITAL_COMMUNITY): Payer: BC Managed Care – PPO

## 2021-01-15 LAB — SARS CORONAVIRUS 2 (TAT 6-24 HRS): SARS Coronavirus 2: NEGATIVE

## 2021-01-15 NOTE — Progress Notes (Signed)
Patient contacted regarding induction of labor. Will be on hold until 01/16/21. Advised of reporting to MAU if DFM leaking fluid bleeding etc. Physician aware.

## 2021-01-16 ENCOUNTER — Inpatient Hospital Stay (HOSPITAL_COMMUNITY)
Admission: AD | Admit: 2021-01-16 | Discharge: 2021-01-17 | DRG: 807 | Disposition: A | Discharge status: Home / Self Care | Payer: BC Managed Care – PPO | Attending: Obstetrics and Gynecology | Admitting: Obstetrics and Gynecology

## 2021-01-16 ENCOUNTER — Encounter (HOSPITAL_COMMUNITY): Payer: Self-pay | Admitting: Anesthesiology

## 2021-01-16 ENCOUNTER — Other Ambulatory Visit: Payer: Self-pay | Admitting: Obstetrics and Gynecology

## 2021-01-16 ENCOUNTER — Encounter (HOSPITAL_COMMUNITY): Payer: Self-pay | Admitting: Obstetrics and Gynecology

## 2021-01-16 ENCOUNTER — Other Ambulatory Visit: Payer: Self-pay

## 2021-01-16 DIAGNOSIS — Z23 Encounter for immunization: Secondary | ICD-10-CM | POA: Diagnosis not present

## 2021-01-16 DIAGNOSIS — O26893 Other specified pregnancy related conditions, third trimester: Secondary | ICD-10-CM | POA: Diagnosis not present

## 2021-01-16 DIAGNOSIS — Z3A40 40 weeks gestation of pregnancy: Secondary | ICD-10-CM | POA: Diagnosis not present

## 2021-01-16 DIAGNOSIS — R0981 Nasal congestion: Secondary | ICD-10-CM | POA: Diagnosis not present

## 2021-01-16 DIAGNOSIS — O99824 Streptococcus B carrier state complicating childbirth: Principal | ICD-10-CM | POA: Diagnosis present

## 2021-01-16 HISTORY — DX: Gastro-esophageal reflux disease without esophagitis: K21.9

## 2021-01-16 LAB — RPR: RPR Ser Ql: NONREACTIVE

## 2021-01-16 LAB — CBC
HCT: 41.2 % (ref 36.0–46.0)
Hemoglobin: 14 g/dL (ref 12.0–15.0)
MCH: 32.8 pg (ref 26.0–34.0)
MCHC: 34 g/dL (ref 30.0–36.0)
MCV: 96.5 fL (ref 80.0–100.0)
Platelets: 157 10*3/uL (ref 150–400)
RBC: 4.27 MIL/uL (ref 3.87–5.11)
RDW: 13.2 % (ref 11.5–15.5)
WBC: 8.7 10*3/uL (ref 4.0–10.5)
nRBC: 0 % (ref 0.0–0.2)

## 2021-01-16 LAB — TYPE AND SCREEN
ABO/RH(D): A POS
Antibody Screen: NEGATIVE

## 2021-01-16 MED ORDER — OXYCODONE HCL 5 MG PO TABS: 5.0000 mg | ORAL_TABLET | ORAL | Status: DC | PRN

## 2021-01-16 MED ORDER — FLEET ENEMA 7-19 GM/118ML RE ENEM: 1.0000 | ENEMA | Freq: Every day | RECTAL | Status: DC | PRN

## 2021-01-16 MED ORDER — EPHEDRINE 5 MG/ML INJ: 10.0000 mg | INTRAVENOUS | Status: DC | PRN

## 2021-01-16 MED ORDER — COCONUT OIL OIL: 1.0000 "application " | TOPICAL_OIL | Status: DC | PRN

## 2021-01-16 MED ORDER — KETOROLAC TROMETHAMINE 30 MG/ML IJ SOLN
30.0000 mg | Freq: Once | INTRAMUSCULAR | Status: AC
  Administered 2021-01-16: 30 mg via INTRAVENOUS
  Filled 2021-01-16: qty 1

## 2021-01-16 MED ORDER — ONDANSETRON HCL 4 MG/2ML IJ SOLN: 4.0000 mg | INTRAMUSCULAR | Status: DC | PRN

## 2021-01-16 MED ORDER — DIPHENHYDRAMINE HCL 50 MG/ML IJ SOLN
12.5000 mg | INTRAMUSCULAR | Status: DC | PRN
  Filled 2021-01-16: qty 1

## 2021-01-16 MED ORDER — LACTATED RINGERS IV SOLN
500.0000 mL | INTRAVENOUS | Status: DC | PRN
Start: 2021-01-16 — End: 2021-01-16

## 2021-01-16 MED ORDER — BENZOCAINE-MENTHOL 20-0.5 % EX AERO
1.0000 "application " | INHALATION_SPRAY | CUTANEOUS | Status: DC | PRN
  Filled 2021-01-16: qty 56

## 2021-01-16 MED ORDER — IBUPROFEN 600 MG PO TABS
600.0000 mg | ORAL_TABLET | Freq: Four times a day (QID) | ORAL | Status: DC
  Administered 2021-01-16 – 2021-01-17 (×4): 600 mg via ORAL
  Filled 2021-01-16 (×4): qty 1

## 2021-01-16 MED ORDER — TERBUTALINE SULFATE 1 MG/ML IJ SOLN: 0.2500 mg | Freq: Once | INTRAMUSCULAR | Status: DC | PRN

## 2021-01-16 MED ORDER — PHENYLEPHRINE 40 MCG/ML (10ML) SYRINGE FOR IV PUSH (FOR BLOOD PRESSURE SUPPORT): 80.0000 ug | PREFILLED_SYRINGE | INTRAVENOUS | Status: DC | PRN

## 2021-01-16 MED ORDER — BISACODYL 10 MG RE SUPP: 10.0000 mg | Freq: Every day | RECTAL | Status: DC | PRN

## 2021-01-16 MED ORDER — OXYCODONE HCL 5 MG PO TABS: 10.0000 mg | ORAL_TABLET | ORAL | Status: DC | PRN

## 2021-01-16 MED ORDER — TETANUS-DIPHTH-ACELL PERTUSSIS 5-2.5-18.5 LF-MCG/0.5 IM SUSY: 0.5000 mL | PREFILLED_SYRINGE | Freq: Once | INTRAMUSCULAR | Status: DC

## 2021-01-16 MED ORDER — LACTATED RINGERS IV SOLN: INTRAVENOUS | Status: DC

## 2021-01-16 MED ORDER — FENTANYL-BUPIVACAINE-NACL 0.5-0.125-0.9 MG/250ML-% EP SOLN
12.0000 mL/h | EPIDURAL | Status: DC | PRN
  Filled 2021-01-16: qty 250

## 2021-01-16 MED ORDER — SODIUM CHLORIDE 0.9 % IV SOLN
5.0000 10*6.[IU] | Freq: Once | INTRAVENOUS | Status: AC
  Administered 2021-01-16: 5 10*6.[IU] via INTRAVENOUS
  Filled 2021-01-16: qty 5

## 2021-01-16 MED ORDER — SOD CITRATE-CITRIC ACID 500-334 MG/5ML PO SOLN: 30.0000 mL | ORAL | Status: DC | PRN

## 2021-01-16 MED ORDER — WITCH HAZEL-GLYCERIN EX PADS: 1.0000 "application " | MEDICATED_PAD | CUTANEOUS | Status: DC | PRN

## 2021-01-16 MED ORDER — MISOPROSTOL 25 MCG QUARTER TABLET: 25.0000 ug | ORAL_TABLET | ORAL | Status: DC

## 2021-01-16 MED ORDER — OXYTOCIN BOLUS FROM INFUSION
333.0000 mL | Freq: Once | INTRAVENOUS | Status: AC
  Administered 2021-01-16: 333 mL via INTRAVENOUS

## 2021-01-16 MED ORDER — LIDOCAINE HCL (PF) 1 % IJ SOLN
30.0000 mL | INTRAMUSCULAR | Status: AC | PRN
  Administered 2021-01-16: 30 mL via SUBCUTANEOUS
  Filled 2021-01-16: qty 30

## 2021-01-16 MED ORDER — DIBUCAINE (PERIANAL) 1 % EX OINT: 1.0000 "application " | TOPICAL_OINTMENT | CUTANEOUS | Status: DC | PRN

## 2021-01-16 MED ORDER — OXYCODONE-ACETAMINOPHEN 5-325 MG PO TABS: 1.0000 | ORAL_TABLET | ORAL | Status: DC | PRN

## 2021-01-16 MED ORDER — ONDANSETRON HCL 4 MG/2ML IJ SOLN: 4.0000 mg | Freq: Four times a day (QID) | INTRAMUSCULAR | Status: DC | PRN

## 2021-01-16 MED ORDER — BUTORPHANOL TARTRATE 1 MG/ML IJ SOLN
1.0000 mg | INTRAMUSCULAR | Status: DC | PRN
  Administered 2021-01-16 (×2): 1 mg via INTRAVENOUS
  Filled 2021-01-16 (×2): qty 1

## 2021-01-16 MED ORDER — OXYTOCIN-SODIUM CHLORIDE 30-0.9 UT/500ML-% IV SOLN: 2.5000 [IU]/h | INTRAVENOUS | Status: DC

## 2021-01-16 MED ORDER — ACETAMINOPHEN 325 MG PO TABS: 650.0000 mg | ORAL_TABLET | ORAL | Status: DC | PRN

## 2021-01-16 MED ORDER — OXYTOCIN-SODIUM CHLORIDE 30-0.9 UT/500ML-% IV SOLN
1.0000 m[IU]/min | INTRAVENOUS | Status: DC
  Administered 2021-01-16: 2 m[IU]/min via INTRAVENOUS
  Filled 2021-01-16: qty 500

## 2021-01-16 MED ORDER — LACTATED RINGERS IV SOLN: 500.0000 mL | Freq: Once | INTRAVENOUS | Status: DC

## 2021-01-16 MED ORDER — OXYCODONE-ACETAMINOPHEN 5-325 MG PO TABS: 2.0000 | ORAL_TABLET | ORAL | Status: DC | PRN

## 2021-01-16 MED ORDER — PENICILLIN G POT IN DEXTROSE 60000 UNIT/ML IV SOLN
3.0000 10*6.[IU] | INTRAVENOUS | Status: DC
  Administered 2021-01-16: 3 10*6.[IU] via INTRAVENOUS
  Filled 2021-01-16: qty 50

## 2021-01-16 MED ORDER — DIPHENHYDRAMINE HCL 25 MG PO CAPS: 25.0000 mg | ORAL_CAPSULE | Freq: Four times a day (QID) | ORAL | Status: DC | PRN

## 2021-01-16 MED ORDER — DOCUSATE SODIUM 100 MG PO CAPS
100.0000 mg | ORAL_CAPSULE | Freq: Two times a day (BID) | ORAL | Status: DC
  Administered 2021-01-17: 100 mg via ORAL
  Filled 2021-01-16: qty 1

## 2021-01-16 MED ORDER — SIMETHICONE 80 MG PO CHEW: 80.0000 mg | CHEWABLE_TABLET | ORAL | Status: DC | PRN

## 2021-01-16 MED ORDER — ONDANSETRON HCL 4 MG PO TABS: 4.0000 mg | ORAL_TABLET | ORAL | Status: DC | PRN

## 2021-01-16 NOTE — Lactation Note (Signed)
This note was copied from a baby's chart. Lactation Consultation Note  Patient Name: Brittany Mccarthy Today's Date: 01/16/2021 Reason for consult: Initial assessment;Term Age:36 hours  Initial visit to 6 hours old infant of a P2 mother. Mother states baby breastfed around 2200 >20 minutes. Demonstrated hand expression, verbalized discomfort. Provided hand pump for colostrum expression, noted 1 drop mother expresses pain with hand pump. Baby is currently swaddled and parents prefer to not disturb her. LC encouraged lots of skin to skin and latching baby since there is not discomfort at breast.  Discussed normal newborn behavior and patterns, signs of good milk transfer, hunger cues, tummy size and benefits of skin to skin.   Plan: 1-Breastfeeding on demand or 8-12 times in 24h period. 2-Use manual pump as needed 3-Encouraged maternal rest, hydration and food intake.   Contact LC as needed for feeds/support/concerns/questions. All questions answered at this time. Provided Lactation services brochure and promoted [employer] booklet information.     Maternal Data Has patient been taught Hand Expression?: Yes Does the patient have breastfeeding experience prior to this delivery?: Yes How long did the patient breastfeed?: a couple of days, mother had to have surgery  Feeding Mother's Current Feeding Choice: Breast Milk and Formula  LATCH Score Latch: Repeated attempts needed to sustain latch, nipple held in mouth throughout feeding, stimulation needed to elicit sucking reflex.  Audible Swallowing: A few with stimulation  Type of Nipple: Flat  Comfort (Breast/Nipple): Soft / non-tender  Hold (Positioning): Assistance needed to correctly position infant at breast and maintain latch.  LATCH Score: 6   Lactation Tools Discussed/Used Tools: Flanges;Pump Flange Size: 21 Breast pump type: Manual Pump Education: Setup, frequency, and cleaning;Milk Storage Reason for Pumping: nipple eversion,  stimulation and supplementation Pumping frequency: as needed Pumped volume:  (1 drop with demonstration)  Interventions Interventions: Breast feeding basics reviewed;Skin to skin;Breast massage;Hand express;Pre-pump if needed;Hand pump;Expressed milk;Education;LC Services brochure  Discharge Pump: Manual  Consult Status Consult Status: Follow-up Date: 01/17/21 Follow-up type: In-patient    Debera Sterba A Higuera Ancidey 01/16/2021, 11:51 PM

## 2021-01-16 NOTE — Progress Notes (Signed)
OB Progress Note  S: Pt more uncomfortable with contractions   O: Today's Vitals   01/16/21 1005 01/16/21 1030 01/16/21 1130 01/16/21 1206  BP: 129/65   125/76  Pulse: 93   84  Resp: 18   20  Temp:      TempSrc:      Weight:      Height:      PainSc:  4  5     Body mass index is 30.83 kg/m.  SVE 6/70/-2, AROM thin meconium  FHR: 135bpm, moderate variability, + accels, no decels Toco: ctx q 2-4 mins   A/P: 35Y [id number] @ [redacted]w[redacted]d, term IOL, now in active labor Fetal wellbeing: cat I tracing IOL :s/p AROM, continue pitocin per protocol GBS+: pencillin Last dose of prophylactic Lovenox > 36 hours ago, discontinue postpartum Pain control: epidural upon patient request      M. Timothy Lasso, MD 01/16/21 12:47 PM

## 2021-01-16 NOTE — Anesthesia Preprocedure Evaluation (Deleted)
Anesthesia Evaluation  Patient identified by MRN, date of birth, ID band Patient awake    Reviewed: Allergy & Precautions, H&P , NPO status , Patient's Chart, lab work & pertinent test results  History of Anesthesia Complications Negative for: history of anesthetic complications  Airway Mallampati: II  TM Distance: >3 FB Neck ROM: full    Dental no notable dental hx.    Pulmonary neg pulmonary ROS,    Pulmonary exam normal breath sounds clear to auscultation       Cardiovascular negative cardio ROS Normal cardiovascular exam Rhythm:regular Rate:Normal     Neuro/Psych negative neurological ROS     GI/Hepatic negative GI ROS, Neg liver ROS,   Endo/Other  negative endocrine ROS  Renal/GU negative Renal ROS     Musculoskeletal  (+) Arthritis ,   Abdominal   Peds  Hematology negative hematology ROS (+)   Anesthesia Other Findings Pregnancy - reports she has scoliosis, no back surgery, IOL for a while now Platelets and allergies reviewed Denies active cardiac or pulmonary symptoms, METS > 4  Denies blood thinning medications, bleeding disorders, hypertension, asthma, supine hypotension syndrome, previous anesthesia difficulties   Reproductive/Obstetrics (+) Pregnancy                             Anesthesia Physical  Anesthesia Plan  ASA: III  Anesthesia Plan: Epidural   Post-op Pain Management:    Induction:   PONV Risk Score and Plan:   Airway Management Planned:   Additional Equipment:   Intra-op Plan:   Post-operative Plan:   Informed Consent: I have reviewed the patients History and Physical, chart, labs and discussed the procedure including the risks, benefits and alternatives for the proposed anesthesia with the patient or authorized representative who has indicated his/her understanding and acceptance.     Dental Advisory Given  Plan Discussed with:  Anesthesiologist, CRNA and Surgeon  Anesthesia Plan Comments:         Anesthesia Quick Evaluation

## 2021-01-16 NOTE — Lactation Note (Signed)
This note was copied from a baby's chart. Lactation Consultation Note  Patient Name: Brittany Mccarthy Today's Date: 01/16/2021 Reason for consult: L&D Initial assessment;Mother's request;Term;Breastfeeding assistance Age:36 hours  Mom plan to EBF. LC assisted with latching in prone position in cross cradle to get more depth. Once on the floor, Mom benefit with hand pump to pre pump 5-10 min before latching to elongate her nipple.  Mom to receive further LC support on the floor.   All questions answered at the end of the visit.   Maternal Data Has patient been taught Hand Expression?: Yes  Feeding Mother's Current Feeding Choice: Breast Milk  LATCH Score Latch: Repeated attempts needed to sustain latch, nipple held in mouth throughout feeding, stimulation needed to elicit sucking reflex.  Audible Swallowing: Spontaneous and intermittent  Type of Nipple: Flat (nipples are small and short shafted)  Comfort (Breast/Nipple): Soft / non-tender  Hold (Positioning): Assistance needed to correctly position infant at breast and maintain latch.  LATCH Score: 7   Lactation Tools Discussed/Used    Interventions Interventions: Breast feeding basics reviewed;Support pillows;Education;Skin to skin;Expressed milk;Breast massage;Breast compression;Hand express;Adjust position  Discharge Pump: Personal  Consult Status Consult Status: Follow-up from L&D Date: 01/17/21 Follow-up type: In-patient    Brittany Mccarthy  Brittany Mccarthy 01/16/2021, 6:19 PM

## 2021-01-16 NOTE — H&P (Signed)
Brittany Mccarthy is a 35 y.o. female [phone] [redacted]w[redacted]d presenting for term IOL. She reports no LOF. Has been having contractions with some blood show since early this morning. Normal FM.    Prenatal care significant for: 1)  Advanced maternal age gravida             MaterniT21 low risk, weekly BPP 2)  Recurrent miscarriage     neg APLS w/u, proph Lovenox- stop pp. PV prog supp 3)  Group B Strep Carrier      NKDA 4) Hx of LGA baby  with G1 9#8oz, mild shoulder dystocia  OB History     Gravida  5   Para  1   Term  1   Preterm      AB  3   Living  1      SAB  3   IAB      Ectopic      Multiple      Live Births  1          Past Medical History:  Diagnosis Date   Arthritis    GERD (gastroesophageal reflux disease)    Kidney stones    Seizures (HCC)    Febrile seizures when young   Past Surgical History:  Procedure Laterality Date   EYE SURGERY Bilateral    x 2   HERNIA REPAIR     WISDOM TOOTH EXTRACTION     Family History: family history is not on file. Social History:  reports that she has never smoked. She has never used smokeless tobacco. She reports that she does not drink alcohol and does not use drugs.     Maternal Diabetes: No Genetic Screening: Normal Maternal Ultrasounds/Referrals: Normal Fetal Ultrasounds or other Referrals:  None Maternal Substance Abuse:  No Significant Maternal Medications:  None Significant Maternal Lab Results:  Group B Strep positive Other Comments:  None  Review of Systems Per HPI Exam Physical Exam  Dilation: 4 Effacement (%): 70 Station: Ballotable Exam by:: Lorn Junes, RNC Blood pressure 129/65, pulse 93, temperature 98 F (36.7 C), temperature source Oral, resp. rate 18, height 5\' 10"  (1.778 m), weight 97.5 kg, last menstrual period 12/24/2019. Gen: NAD, resting comfortably CVS: normal pulses Lungs: Nonlabored respirations Abd: Gravid abdomen, Leopolds 8#  Fetal testing: 125bpm, moderate variability, +  accels, no decels Toco: Ctx q 2-3 mins  Prenatal labs: ABO, Rh:  --/--/A POS (11/02 05-28-1973) Antibody: NEG (11/02 05-28-1973) Rubella: Immune (03/10 0000) RPR: Nonreactive (03/10 0000)  HBsAg: Negative (03/10 0000)  HIV: Non-reactive (03/10 0000)  GBS: Positive/-- (09/29 0000)   Assessment/Plan: 35Y 03-12-1997 @ [redacted]w[redacted]d, term IOL Fetal wellbeing: cat I tracing IOL: in early labor upon arrival for induction, pitocin started for augmentation. Plan for AROM after 4 hours of penicillin GBS+: pencillin Last dose of prophylactic Lovenox > 36 hours ago, discontinue postpartum Pain control: epidural upon patient request    [redacted]w[redacted]d 01/16/2021, 10:55 AM

## 2021-01-16 NOTE — Progress Notes (Signed)
OB Progress Note  S: Pt very uncomfortable feeling urge to push   O: Today's Vitals   01/16/21 1427 01/16/21 1429 01/16/21 1452 01/16/21 1601  BP:  112/63  125/75  Pulse:  (!) 130  100  Resp:  20  (!) 22  Temp:    98.5 F (36.9 C)  TempSrc:    Oral  Weight:      Height:      PainSc: 10-Worst pain ever  7     Body mass index is 30.83 kg/m.  SVE 7.5/90/-1, circumferential edema of cervix.   FHR: 135bpm, mod variability, + accels, early/variable decels with contractions Toco: ctx q 2-3 min   A/P: A/P: 35Y [id number] @ [redacted]w[redacted]d, term IOL, now in active labor Fetal wellbeing: cat I tracing IOL :s/p AROM, active labor, making progress but now involuntarily pushing on 7.5cm cervix which is causing edema. I suggested epidural will likely help her relax during this phase, she agrees. GBS+: pencillin Last dose of prophylactic Lovenox > 36 hours ago, discontinue postpartum Pain control: patient now requesting epidural, anesthesia notified.   Alinda Deem, MD

## 2021-01-17 LAB — CBC
HCT: 36.7 % (ref 36.0–46.0)
Hemoglobin: 12.8 g/dL (ref 12.0–15.0)
MCH: 33.3 pg (ref 26.0–34.0)
MCHC: 34.9 g/dL (ref 30.0–36.0)
MCV: 95.6 fL (ref 80.0–100.0)
Platelets: 154 10*3/uL (ref 150–400)
RBC: 3.84 MIL/uL — ABNORMAL LOW (ref 3.87–5.11)
RDW: 13.5 % (ref 11.5–15.5)
WBC: 14.4 10*3/uL — ABNORMAL HIGH (ref 4.0–10.5)
nRBC: 0 % (ref 0.0–0.2)

## 2021-01-17 MED ORDER — IBUPROFEN 600 MG PO TABS: 600.0000 mg | ORAL_TABLET | Freq: Four times a day (QID) | ORAL | 0 refills | Status: DC

## 2021-01-17 NOTE — Discharge Instructions (Signed)
As per discharge pamphlet °

## 2021-01-17 NOTE — Progress Notes (Signed)
PPD #1 No problems, would like to go home tonight Afeb, VSS Fundus firm, NT at U-1 Continue routine postpartum care, d/c home this pm if baby ok to go

## 2021-01-17 NOTE — Discharge Summary (Signed)
Postpartum Discharge Summary      Patient Name: Brittany Mccarthy DOB: 02-Dec-1985 MRN: [id number]  Date of admission: 01/16/2021 Delivery date:01/16/2021  Delivering provider: Derl Barrow E  Date of discharge: 01/17/2021  Admitting diagnosis: Indication for care in labor and delivery, antepartum [O75.9] Intrauterine pregnancy: [redacted]w[redacted]d     Secondary diagnosis:  Active Problems:   Indication for care in labor and delivery, antepartum     Discharge diagnosis: Term Pregnancy Delivered                                               Hospital course: Induction of Labor With Vaginal Delivery   35 y.o. yo Brittany Mccarthy at [redacted]w[redacted]d was admitted to the hospital 01/16/2021 for induction of labor.  Indication for induction: Elective.  Patient had an uncomplicated labor course as follows: Membrane Rupture Time/Date: 12:40 PM ,01/16/2021   Delivery Method:Vaginal, Spontaneous  Episiotomy: None  Lacerations:  1st degree;Perineal  Details of delivery can be found in separate delivery note-short/mild shoulder dystocia.  Patient had a routine postpartum course. Patient is discharged home 01/17/21.  Newborn Data: Birth date:01/16/2021  Birth time:5:29 PM  Gender:Female  Living status:Living  Apgars:7 ,9  Weight:4490 g    Physical exam  Vitals:   01/16/21 1845 01/16/21 1940 01/16/21 2329 01/17/21 0410  BP: 112/66 122/79 125/79 (!) 114/58  Pulse: 89 87 (!) 107 85  Resp: 16 18 18 18   Temp: 98.4 F (36.9 C) 97.8 F (36.6 C) 98.4 F (36.9 C) 98.5 F (36.9 C)  TempSrc: Oral Oral Oral Oral  Weight:      Height:       General: alert Lochia: appropriate Uterine Fundus: firm  Labs: Lab Results  Component Value Date   WBC 14.4 (H) 01/17/2021   HGB 12.8 01/17/2021   HCT 36.7 01/17/2021   MCV 95.6 01/17/2021   PLT 154 01/17/2021   CMP Latest Ref Rng & Units 06/16/2014  Glucose 70 - 99 mg/dL 94  BUN 6 - 23 mg/dL 12  Creatinine 08/16/2014 - 3.81 mg/dL 0.17  Sodium 5.10 - 258 mEq/L 138  Potassium 3.5 -  5.1 mEq/L 4.4  Chloride 96 - 112 mEq/L 106  CO2 19 - 32 mEq/L 28  Calcium 8.4 - 10.5 mg/dL 9.4  Total Protein 6.0 - 8.3 g/dL 6.7  Total Bilirubin 0.2 - 1.2 mg/dL 0.3  Alkaline Phos 39 - 117 U/L 57  AST 0 - 37 U/L 18  ALT 0 - 35 U/L 13   Edinburgh Score: No flowsheet data found.    After visit meds:  Allergies as of 01/17/2021       Reactions   Fish Allergy Anaphylaxis        Medication List     STOP taking these medications    enoxaparin 40 MG/0.4ML injection Commonly known as: LOVENOX       TAKE these medications    cetirizine 10 MG tablet Commonly known as: ZYRTEC Take 10 mg by mouth daily.   fluticasone 50 MCG/ACT nasal spray Commonly known as: FLONASE Place into both nostrils daily.   ibuprofen 600 MG tablet Commonly known as: ADVIL Take 1 tablet (600 mg total) by mouth every 6 (six) hours.   multivitamin-prenatal 27-0.8 MG Tabs tablet Take 1 tablet by mouth daily at 12 noon.         Discharge home in  stable condition Infant Feeding: Breast Infant Disposition:home with mother Discharge instruction: per After Visit Summary and Postpartum booklet. Activity: Advance as tolerated. Pelvic rest for 6 weeks.  Diet: routine diet Postpartum Appointment:6 weeks Follow up Visit:  Follow-up Information     Brittany Areola, DO. Schedule an appointment as soon as possible for a visit in 6 week(s).   Specialty: Obstetrics and Gynecology Contact information: [address] [place] [address] [phone]                     01/17/2021 Brittany Niece, MD

## 2021-01-30 ENCOUNTER — Telehealth (HOSPITAL_COMMUNITY): Payer: Self-pay | Admitting: *Deleted

## 2021-01-30 NOTE — Telephone Encounter (Signed)
Hospital Discharge Follow-Up Call:  Patient reports that she is well and has no concerns about her healing process.  EPDS today was 1 and she endorses this accurately reflects that she is doing well emotionally.  Patient says that baby is well and she has no concerns about baby's health.  She reports that baby sleeps in a bassinet in her bedroom.  Reviewed ABCs of Safe Sleep.

## 2021-02-23 IMAGING — US US OB < 14 WEEKS - US OB TV
1 series · 15 of 28 positions shown · non-contrast
Comparison: None.

CLINICAL DATA: Heavy bleeding 3 days

EXAM:
OBSTETRIC <14 WK US AND TRANSVAGINAL OB US
TECHNIQUE: Both transabdominal and transvaginal ultrasound examinations were
performed for complete evaluation of the gestation as well as the
maternal uterus, adnexal regions, and pelvic cul-de-sac.
Transvaginal technique was performed to assess early pregnancy.

[Series 1: us ob < 14 weeks - us ob tv · 15 of 112 slices shown]
[im 1/112]
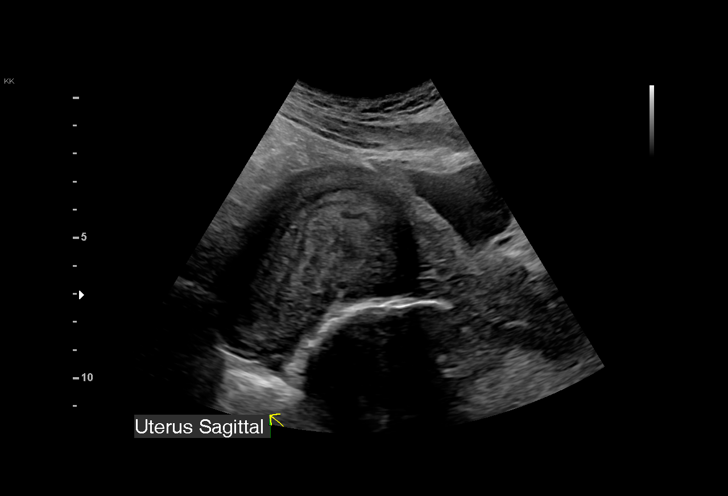
[im 9/112]
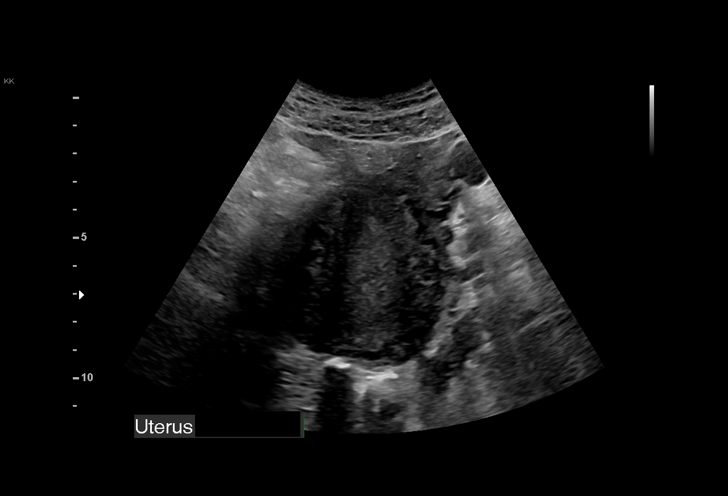
[im 17/112]
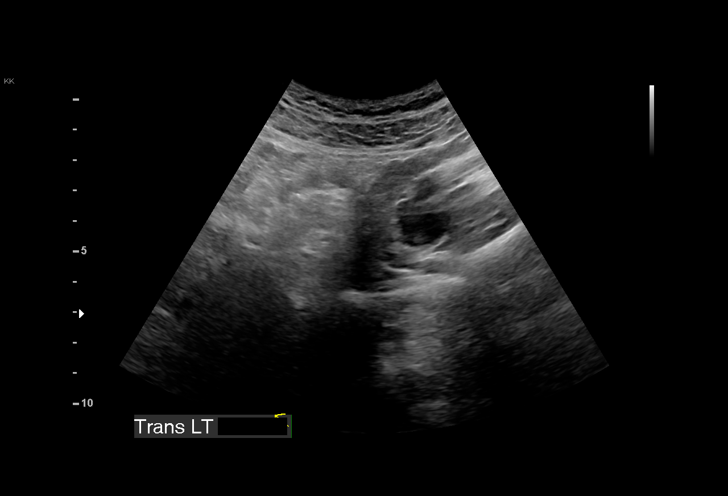
[im 25/112]
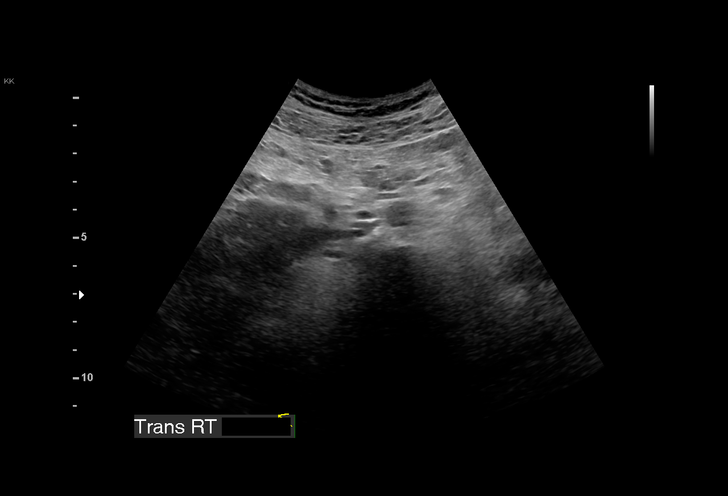
[im 33/112]
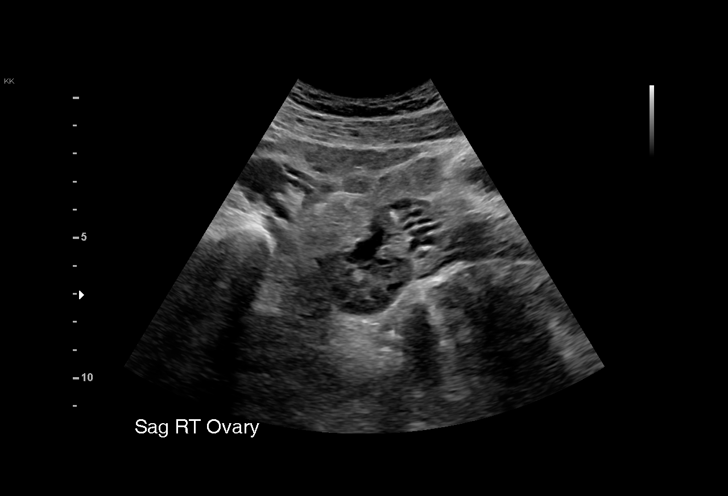
[im 42/112]
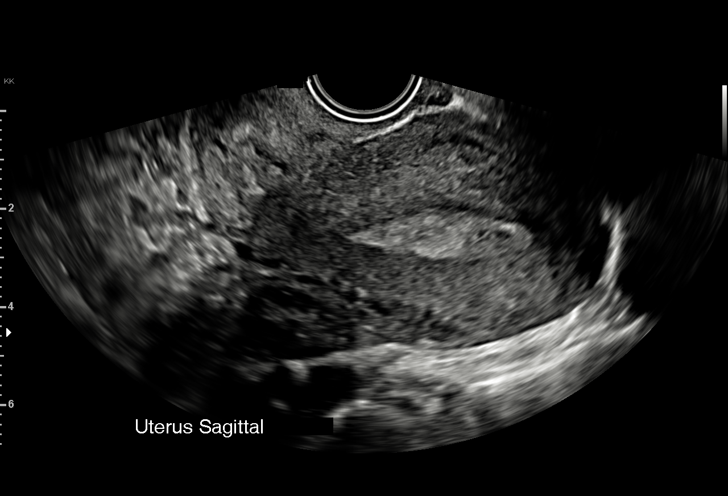
[im 50/112]
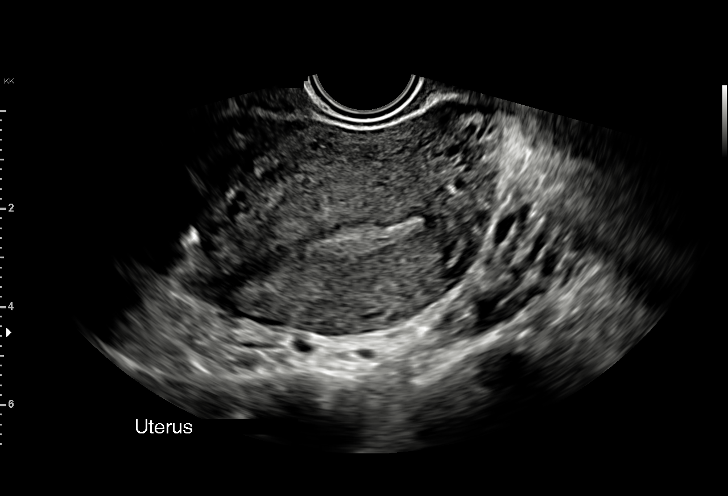
[im 58/112]
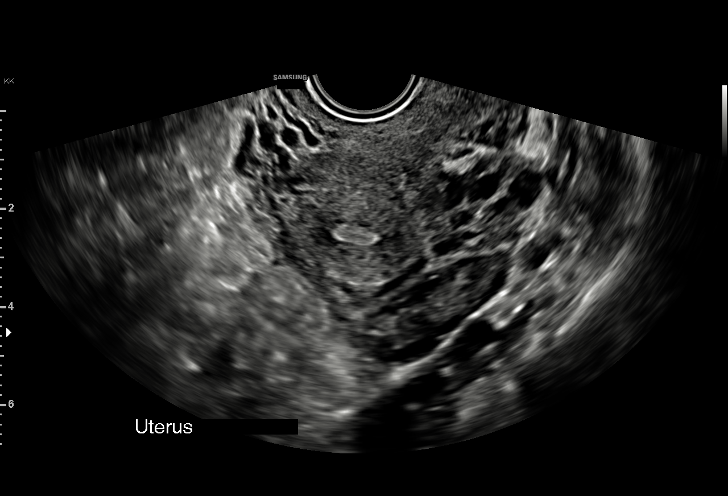
[im 62/112]
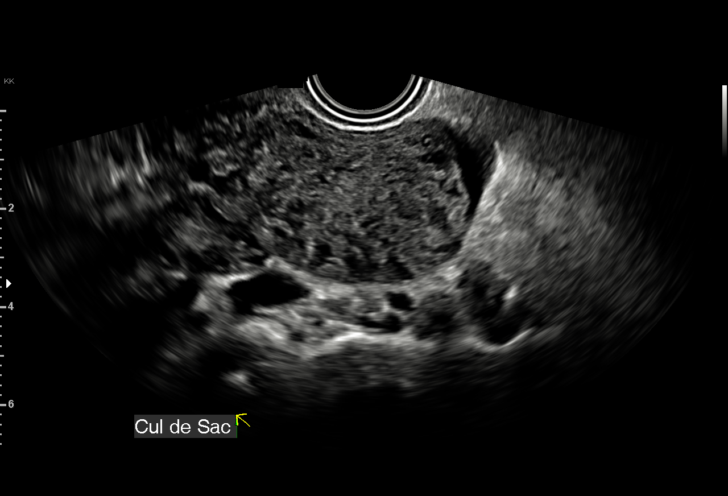
[im 70/112]
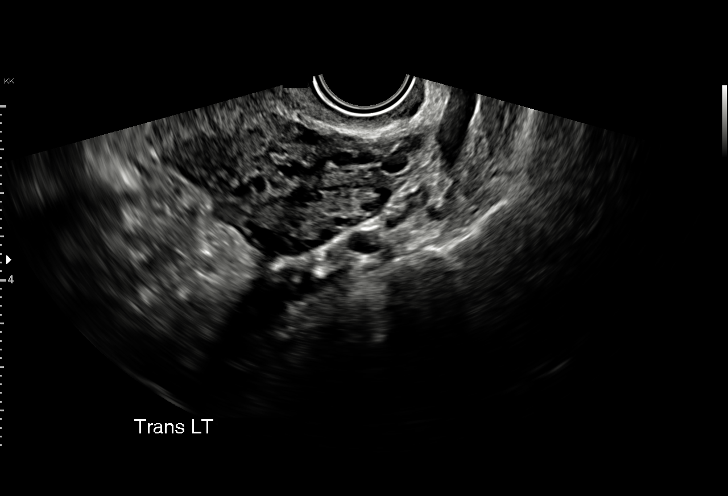
[im 79/112]
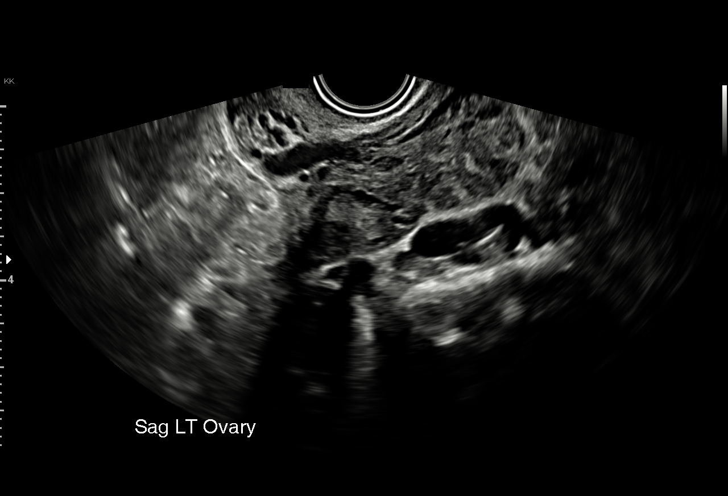
[im 87/112]
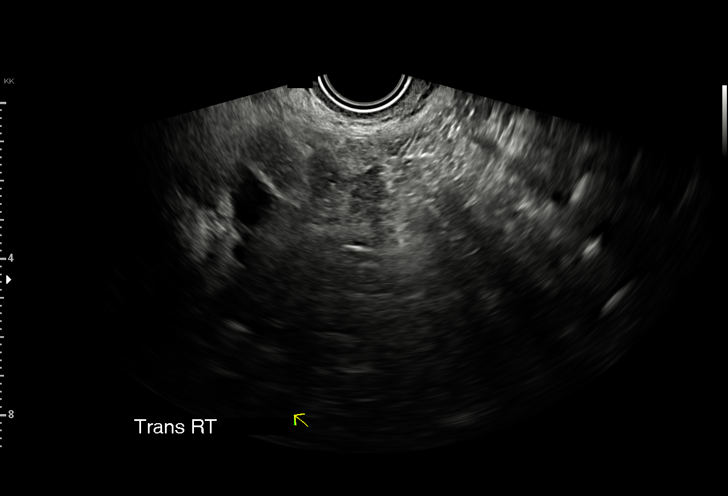
[im 95/112]
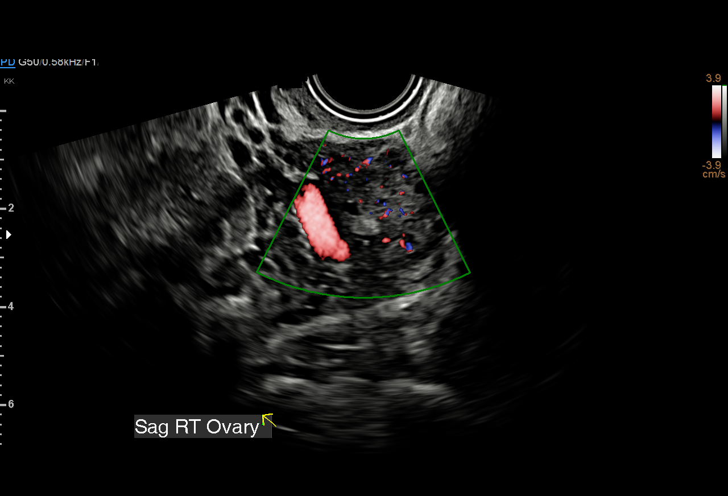
[im 103/112]
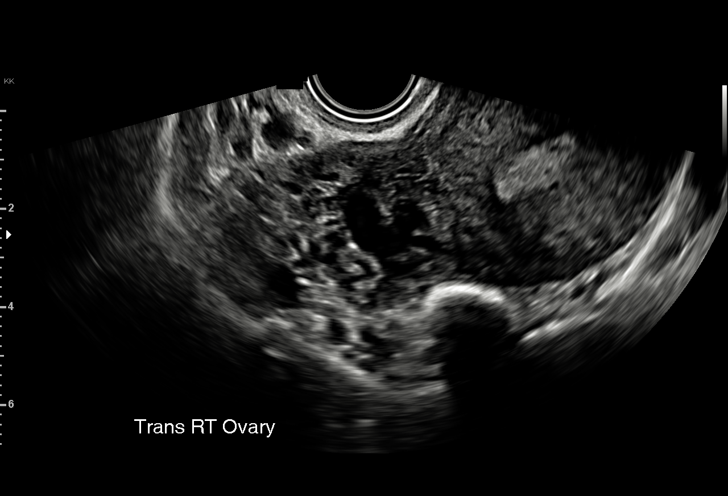
[im 112/112]
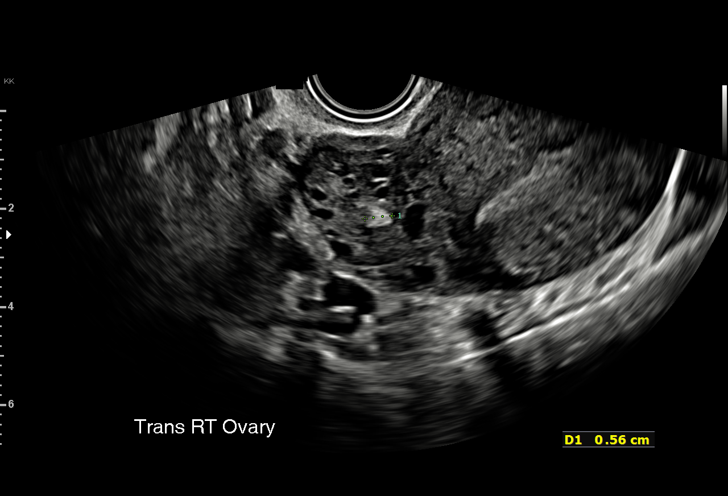

[15 of 28 positions shown; findings below may reference images not displayed]

FINDINGS: Intrauterine gestational sac: None

Yolk sac:  Not Visualized.

Subchorionic hemorrhage:  None visualized.

Maternal uterus/adnexae: Tiny 5 mm echogenic foci seen within the
right ovary which may represent a hemorrhagic cyst. The left ovary
is normal in appearance. There is headache heterogeneous thickened
endometrium measuring 8.8 mm. Small amount of free fluid seen the
cul-de-sac.
IMPRESSION: Given the history of a positive pregnancy test, differential
considerations include intrauterine gestation too early to
visualize, completed abortion, or non-visualized ectopic pregnancy.
Close clinical correlation is recommended with serial beta-hCG and
followup ultrasound as warranted.

## 2021-02-27 DIAGNOSIS — Z1332 Encounter for screening for maternal depression: Secondary | ICD-10-CM | POA: Diagnosis not present

## 2021-02-27 DIAGNOSIS — Z3009 Encounter for other general counseling and advice on contraception: Secondary | ICD-10-CM | POA: Diagnosis not present

## 2021-02-27 DIAGNOSIS — Z1389 Encounter for screening for other disorder: Secondary | ICD-10-CM | POA: Diagnosis not present

## 2021-06-14 ENCOUNTER — Ambulatory Visit
Admission: RE | Admit: 2021-06-14 | Discharge: 2021-06-14 | Disposition: A | Discharge status: Home / Self Care | Payer: BC Managed Care – PPO | Source: Ambulatory Visit | Attending: Emergency Medicine | Admitting: Emergency Medicine

## 2021-06-14 ENCOUNTER — Other Ambulatory Visit: Payer: Self-pay

## 2021-06-14 VITALS — BP 108/77 | HR 94 | Temp 98.6°F | Resp 18

## 2021-06-14 DIAGNOSIS — J020 Streptococcal pharyngitis: Secondary | ICD-10-CM | POA: Diagnosis not present

## 2021-06-14 DIAGNOSIS — T3695XA Adverse effect of unspecified systemic antibiotic, initial encounter: Secondary | ICD-10-CM

## 2021-06-14 LAB — POCT RAPID STREP A (OFFICE): Rapid Strep A Screen: POSITIVE — AB

## 2021-06-14 MED ORDER — TERCONAZOLE 0.8 % VA CREA: 1.0000 | TOPICAL_CREAM | Freq: Every day | VAGINAL | 1 refills | Status: AC

## 2021-06-14 MED ORDER — CEFDINIR 300 MG PO CAPS: 300.0000 mg | ORAL_CAPSULE | Freq: Two times a day (BID) | ORAL | 0 refills | Status: AC

## 2021-06-14 NOTE — ED Triage Notes (Addendum)
Patient presents to [facility] for evaluation of sore throat x 3-4 days, denies feverts, hx of allergies, but son was positive for strep without a sore throat.   ?

## 2021-06-14 NOTE — Discharge Instructions (Addendum)
Your rapid strep test today is positive.  Please begin antibiotics now.  A prescription has been sent to your pharmacy.  Please take all doses as prescribed.  ? ?Please discard your toothbrush and any other oral devices that you are currently using and replace them with new ones.   ? ?After taking antibiotics for 24 hours, you are no longer considered contagious and should begin to feel much better.   ? ?Please see the list below for recommended medications, dosages and frequencies to provide relief of your current symptoms:   ?  ?Cefdinir (Omnicef):  1 capsule twice daily for 10 days, you can take it with or without food.  This antibiotic can cause upset stomach, this will resolve once antibiotics are complete.  You are welcome to use a probiotic, eat yogurt, take Imodium while taking this medication.  Please avoid other systemic medications such as Maalox, Pepto-Bismol or milk of magnesia as they can interfere with your body's ability to absorb the antibiotics.  According to the [facility], cefdinir was not detectable and breastmilk following a single dose of cefdinir 600 mg.  In general, if antibiotics are present in milk, nondose related modification of bowel flora may occur so please monitor your infant for GI disturbances such as thrush and diarrhea while you are taking cefdinir. ? ?It is very important that you take antibiotics as prescribed.  If you skip doses or do not complete the full course of antibiotics, you put yourself at significant risk of recurrent infection which can often be worse than your initial infection. ? ?Ibuprofen  (Advil, Motrin): This is a good anti-inflammatory medication which addresses aches, pains and inflammation of the upper airways that causes sinus and nasal congestion as well as in the lower airways which makes your cough feel tight and sometimes burn.  I recommend that you take between 400 to 600 mg every 6-8 hours as needed.    ?  ?Acetaminophen (Tylenol):  This is a good fever reducer.  If your body temperature rises above 101.5 as measured with a thermometer, it is recommended that you take 1,000 mg every 8 hours until your temperature falls below 101.5, please not take more than 3,000 mg of acetaminophen either as a separate medication or as in ingredient in an over-the-counter cold/flu preparation within a 24-hour period.    ?  ?Chloraseptic Throat Spray: Spray 5 sprays into affected area every 2 hours, hold for 15 seconds and either swallow or spit it out.  This is a excellent numbing medication because it is a spray, you can put it right where you needed and so sucking on a lozenge and numbing your entire mouth.    ?  ?Please follow-up within the next 7-10 days either with your primary care provider or urgent care if your symptoms do not resolve.  If you do not have a primary care provider, we will assist you in finding one. ?  ?Thank you for visiting urgent care today.  We appreciate the opportunity to participate in your care. ? ?

## 2021-06-14 NOTE — ED Provider Notes (Signed)
?[facility] ? ? ? ?CSN: [id number] ?Arrival date & time: 06/14/21  1257 ?  ? ?HISTORY  ? ?Chief Complaint  ?Patient presents with  ? Sore Throat  ?  Strep test. My son tested positive and my throat has been sore. Also, am currently breastfeeding. - Entered by patient  ? ?HPI ?Brittany Mccarthy is a 36 y.o. female. Patient presents to urgent care today complaining of a sore throat for the past 4 days.  Patient denies fever, history of allergies, headache, upset stomach, body ache, fatigue.  Patient states her son tested positive for strep throat.  Patient states she is currently breast-feeding. ? ?The history is provided by the patient.  ?Past Medical History:  ?Diagnosis Date  ? Arthritis   ? GERD (gastroesophageal reflux disease)   ? Kidney stones   ? Seizures (HCC)   ? Febrile seizures when young  ? ?Patient Active Problem List  ? Diagnosis Date Noted  ? Indication for care in labor and delivery, antepartum 01/16/2021  ? SVD (spontaneous vaginal delivery) 12/07/2015  ? Postpartum care following vaginal delivery 12/07/2015  ? Normal pregnancy in third trimester 12/06/2015  ? Right knee pain 02/05/2015  ? Knee pain, right 07/17/2014  ? Back pain at L4-L5 level 07/17/2014  ? Arthralgia 06/16/2014  ? Fatigue 06/16/2014  ? OTH SPECIFIED INFLAMMATORY POLYARTHROPATHIES OTH 03/30/2009  ? TREMOR 03/30/2009  ? PARESTHESIA 03/30/2009  ? ?Past Surgical History:  ?Procedure Laterality Date  ? EYE SURGERY Bilateral   ? x 2  ? HERNIA REPAIR    ? WISDOM TOOTH EXTRACTION    ? ?OB History   ? ? Gravida  ?7  ? Para  ?2  ? Term  ?2  ? Preterm  ?   ? AB  ?5  ? Living  ?2  ?  ? ? SAB  ?5  ? IAB  ?   ? Ectopic  ?   ? Multiple  ?0  ? Live Births  ?2  ?   ?  ?  ? ?Home Medications   ? ?Prior to Admission medications   ?Medication Sig Start Date End Date Taking? Authorizing Provider  ?cetirizine (ZYRTEC) 10 MG tablet Take 10 mg by mouth daily.    [provider]  ?fluticasone (FLONASE) 50 MCG/ACT nasal spray Place into both  nostrils daily.    [provider]  ?ibuprofen (ADVIL) 600 MG tablet Take 1 tablet (600 mg total) by mouth every 6 (six) hours. 01/17/21   Meisinger, Tawanna Cooler, MD  ?Prenatal Vit-Fe Fumarate-FA (MULTIVITAMIN-PRENATAL) 27-0.8 MG TABS tablet Take 1 tablet by mouth daily at 12 noon.    [provider]  ? ?Family History ?Family History  ?Problem Relation Age of Onset  ? Healthy Mother   ? Healthy Father   ? Colon cancer Neg Hx   ? Esophageal cancer Neg Hx   ? Pancreatic cancer Neg Hx   ? Stomach cancer Neg Hx   ? Liver disease Neg Hx   ? ?Social History ?Social History  ? ?Tobacco Use  ? Smoking status: Never  ? Smokeless tobacco: Never  ?Vaping Use  ? Vaping Use: Never used  ?Substance Use Topics  ? Alcohol use: No  ?  Alcohol/week: 0.0 standard drinks  ? Drug use: Never  ? ?Allergies   ?Fish allergy ? ?Review of Systems ?Review of Systems ?Pertinent findings noted in history of present illness.  ? ?Physical Exam ?Triage Vital Signs ?ED Triage Vitals  ?Enc Vitals Group  ?  BP 01/11/21 0827 (!) 147/82  ?   Pulse Rate 01/11/21 0827 72  ?   Resp 01/11/21 0827 18  ?   Temp 01/11/21 0827 98.3 ?F (36.8 ?C)  ?   Temp Source 01/11/21 0827 Oral  ?   SpO2 01/11/21 0827 98 %  ?   Weight --   ?   Height --   ?   Head Circumference --   ?   Peak Flow --   ?   Pain Score 01/11/21 0826 5  ?   Pain Loc --   ?   Pain Edu? --   ?   Excl. in GC? --   ?No data found. ? ?Updated Vital Signs ?BP 108/77   Pulse 94   Temp 98.6 ?F (37 ?C) (Oral)   Resp 18   SpO2 97%   Breastfeeding Yes  ? ?Physical Exam ?Constitutional:   ?   General: She is not in acute distress. ?   Appearance: She is well-developed. She is ill-appearing. She is not toxic-appearing.  ?HENT:  ?   Head: Normocephalic and atraumatic.  ?   Salivary Glands: Right salivary gland is diffusely enlarged and tender. Left salivary gland is diffusely enlarged and tender.  ?   Right Ear: Hearing and external ear normal.  ?   Left Ear: Hearing and external ear normal.   ?   Ears:  ?   Comments: Bilateral EACs with mild erythema, bilateral TMs are normal ?   Nose: No mucosal edema, congestion or rhinorrhea.  ?   Right Turbinates: Not enlarged, swollen or pale.  ?   Left Turbinates: Not enlarged or swollen.  ?   Right Sinus: No maxillary sinus tenderness or frontal sinus tenderness.  ?   Left Sinus: No maxillary sinus tenderness or frontal sinus tenderness.  ?   Mouth/Throat:  ?   Lips: Pink. No lesions.  ?   Mouth: Mucous membranes are moist. No oral lesions or angioedema.  ?   Dentition: No gingival swelling.  ?   Tongue: No lesions.  ?   Palate: No mass.  ?   Pharynx: Uvula midline. Pharyngeal swelling, oropharyngeal exudate and posterior oropharyngeal erythema present. No uvula swelling.  ?   Tonsils: Tonsillar exudate present. 2+ on the right. 2+ on the left.  ?Eyes:  ?   Extraocular Movements: Extraocular movements intact.  ?   Conjunctiva/sclera: Conjunctivae normal.  ?   Pupils: Pupils are equal, round, and reactive to light.  ?Neck:  ?   Thyroid: No thyroid mass, thyromegaly or thyroid tenderness.  ?   Trachea: Tracheal tenderness present. No abnormal tracheal secretions or tracheal deviation.  ?   Comments: Voice is muffled ?Cardiovascular:  ?   Rate and Rhythm: Normal rate and regular rhythm.  ?   Pulses: Normal pulses.  ?   Heart sounds: Normal heart sounds, S1 normal and S2 normal. No murmur heard. ?  No friction rub. No gallop.  ?Pulmonary:  ?   Effort: Pulmonary effort is normal. No accessory muscle usage, prolonged expiration, respiratory distress or retractions.  ?   Breath sounds: No stridor, decreased air movement or transmitted upper airway sounds. No decreased breath sounds, wheezing, rhonchi or rales.  ?Abdominal:  ?   General: Bowel sounds are normal.  ?   Palpations: Abdomen is soft.  ?   Tenderness: There is generalized abdominal tenderness. There is no right CVA tenderness, left CVA tenderness or rebound. Negative signs include Murphy's sign.  ?  Hernia:  No hernia is present.  ?Musculoskeletal:     ?   General: No tenderness. Normal range of motion.  ?   Cervical back: Full passive range of motion without pain, normal range of motion and neck supple.  ?   Right lower leg: No edema.  ?   Left lower leg: No edema.  ?Lymphadenopathy:  ?   Cervical: Cervical adenopathy present.  ?   Right cervical: Superficial cervical adenopathy present.  ?   Left cervical: Superficial cervical adenopathy present.  ?Skin: ?   General: Skin is warm and dry.  ?   Findings: No erythema, lesion or rash.  ?Neurological:  ?   General: No focal deficit present.  ?   Mental Status: She is alert and oriented to person, place, and time. Mental status is at baseline.  ?Psychiatric:     ?   Mood and Affect: Mood normal.     ?   Behavior: Behavior normal.     ?   Thought Content: Thought content normal.     ?   Judgment: Judgment normal.  ? ? ?Visual Acuity ?Right Eye Distance:   ?Left Eye Distance:   ?Bilateral Distance:   ? ?Right Eye Near:   ?Left Eye Near:    ?Bilateral Near:    ? ?UC Couse / Diagnostics / Procedures:  ?  ?EKG ? ?Radiology ?No results found. ? ?Procedures ?Procedures (including critical care time) ? ?UC Diagnoses / Final Clinical Impressions(s)   ?I have reviewed the triage vital signs and the nursing notes. ? ?Pertinent labs & imaging results that were available during my care of the patient were reviewed by me and considered in my medical decision making (see chart for details).   ?Final diagnoses:  ?Streptococcal pharyngitis  ? ?Rapid strep test today is positive.  Patient provided with cefdinir and advised that this is safe to take while breast-feeding.  Patient also advised that she can take Tylenol and ibuprofen as needed for discomfort.  Return precautions advised. ? ?ED Prescriptions   ? ? Medication Sig Dispense Auth. Provider  ? cefdinir (OMNICEF) 300 MG capsule Take 1 capsule (300 mg total) by mouth 2 (two) times daily for 10 days. 20 capsule Theadora RamaMorgan, Aalaya Yadao Scales,  PA-C  ? ?  ? ?PDMP not reviewed this encounter. ? ?Pending results:  ?Labs Reviewed  ?POCT RAPID STREP A (OFFICE) - Abnormal; Notable for the following components:  ?    Result Value  ? Rapid Strep A Screen

## 2021-07-01 DIAGNOSIS — Z124 Encounter for screening for malignant neoplasm of cervix: Secondary | ICD-10-CM | POA: Diagnosis not present

## 2021-07-01 DIAGNOSIS — Z1389 Encounter for screening for other disorder: Secondary | ICD-10-CM | POA: Diagnosis not present

## 2021-07-01 DIAGNOSIS — Z130 Encounter for screening for diseases of the blood and blood-forming organs and certain disorders involving the immune mechanism: Secondary | ICD-10-CM | POA: Diagnosis not present

## 2021-07-01 DIAGNOSIS — Z01419 Encounter for gynecological examination (general) (routine) without abnormal findings: Secondary | ICD-10-CM | POA: Diagnosis not present

## 2021-07-22 ENCOUNTER — Encounter: Payer: Self-pay | Admitting: Medical

## 2021-07-25 ENCOUNTER — Ambulatory Visit: Payer: BC Managed Care – PPO | Admitting: Medical

## 2021-07-25 VITALS — BP 127/79 | HR 90 | Temp 97.6°F | Resp 18 | Ht 70.0 in | Wt 172.2 lb

## 2021-07-25 DIAGNOSIS — R350 Frequency of micturition: Secondary | ICD-10-CM

## 2021-07-25 DIAGNOSIS — R109 Unspecified abdominal pain: Secondary | ICD-10-CM | POA: Diagnosis not present

## 2021-07-25 DIAGNOSIS — R944 Abnormal results of kidney function studies: Secondary | ICD-10-CM | POA: Diagnosis not present

## 2021-07-25 LAB — COMPREHENSIVE METABOLIC PANEL
ALT: 17 U/L (ref 0–35)
AST: 15 U/L (ref 0–37)
Albumin: 4.5 g/dL (ref 3.5–5.2)
Alkaline Phosphatase: 118 U/L — ABNORMAL HIGH (ref 39–117)
BUN: 16 mg/dL (ref 6–23)
CO2: 29 mEq/L (ref 19–32)
Calcium: 9.3 mg/dL (ref 8.4–10.5)
Chloride: 104 mEq/L (ref 96–112)
Creatinine, Ser: 0.76 mg/dL (ref 0.40–1.20)
GFR: 100.37 mL/min (ref >60.00)
Glucose, Bld: 78 mg/dL (ref 70–99)
Potassium: 4.2 mEq/L (ref 3.5–5.1)
Sodium: 140 mEq/L (ref 135–145)
Total Bilirubin: 0.5 mg/dL (ref 0.2–1.2)
Total Protein: 7.2 g/dL (ref 6.0–8.3)

## 2021-07-25 LAB — POCT URINALYSIS DIPSTICK
Bilirubin, UA: NEGATIVE
Blood, UA: NEGATIVE
Glucose, UA: NEGATIVE
Ketones, UA: NEGATIVE
Leukocytes, UA: NEGATIVE
Nitrite, UA: NEGATIVE
Protein, UA: NEGATIVE
Spec Grav, UA: 1.02 (ref 1.010–1.025)
Urobilinogen, UA: 0.2 E.U./dL
pH, UA: 6 (ref 5.0–8.0)

## 2021-07-25 NOTE — Patient Instructions (Addendum)
For recent frequent urination and faint suprapubic pressure. Urine clear but with high level frequency will get urine culture ? ?Mild decrease gfr and with frequency decided to get cmp today. ? ?No blood in urine but hx of kidney stone. If renal colic type pain starts would get ct. ? ?Follow up 2 weeks or sooner if needed. ?

## 2021-07-25 NOTE — Progress Notes (Signed)
? ?Subjective:  ? ? Patient ID: Brittany Mccarthy, female    DOB: 1985/05/21, 36 y.o.   MRN: [id number] ? ?HPI ? ?Pt in for some urinary complaints. Pt stats drinks 80-100 0z water a day. Hx of kidney stones in past. Recent mild lower level back pain. She thinks urine mild concentrated appearance. She feels like going more frequenty. Urinating evey 90 minutes to 2 hours past 7-8 days. ? ?Pt does have 62 month old baby and  1 yo also natural delivery. ? ?Hx of uti. When younger. Less over past 5 years. ? ?Pt does have some stress incontinence.  ? ?Pt is breast feeding up to a year if possible. ? ? ? ?Review of Systems  ?Constitutional:  Negative for chills, fatigue and fever.  ?Respiratory:  Negative for cough, chest tightness, shortness of breath and wheezing.   ?Cardiovascular:  Negative for chest pain and palpitations.  ?Gastrointestinal:  Positive for abdominal pain. Negative for abdominal distention, nausea and vomiting.  ?     Suprabapubic pressure faint.  ?Genitourinary:  Negative for dysuria.  ?Musculoskeletal:  Negative for back pain.  ?Skin:  Negative for rash.  ?Psychiatric/Behavioral:  Negative for behavioral problems and confusion.   ? ? ? ?Past Medical History:  ?Diagnosis Date  ? Arthritis   ? GERD (gastroesophageal reflux disease)   ? Kidney stones   ? Seizures (HCC)   ? Febrile seizures when young  ? ?  ?Social History  ? ?Socioeconomic History  ? Marital status: Married  ?  Spouse name: Nida Boatman  ? Number of children: Not on file  ? Years of education: Not on file  ? Highest education level: Not on file  ?Occupational History  ? Not on file  ?Tobacco Use  ? Smoking status: Never  ? Smokeless tobacco: Never  ?Vaping Use  ? Vaping Use: Never used  ?Substance and Sexual Activity  ? Alcohol use: No  ?  Alcohol/week: 0.0 standard drinks  ? Drug use: Never  ? Sexual activity: Yes  ?  Birth control/protection: None  ?Other Topics Concern  ? Not on file  ?Social History Narrative  ? Not on file  ? ?Social Determinants  of Health  ? ?Financial Resource Strain: Not on file  ?Food Insecurity: Not on file  ?Transportation Needs: Not on file  ?Physical Activity: Not on file  ?Stress: Not on file  ?Social Connections: Not on file  ?Intimate Partner Violence: Not on file  ? ? ?Past Surgical History:  ?Procedure Laterality Date  ? EYE SURGERY Bilateral   ? x 2  ? HERNIA REPAIR    ? WISDOM TOOTH EXTRACTION    ? ? ?Family History  ?Problem Relation Age of Onset  ? Healthy Mother   ? Healthy Father   ? Colon cancer Neg Hx   ? Esophageal cancer Neg Hx   ? Pancreatic cancer Neg Hx   ? Stomach cancer Neg Hx   ? Liver disease Neg Hx   ? ? ?Allergies  ?Allergen Reactions  ? Fish Allergy Anaphylaxis  ? ? ?Current Outpatient Medications on File Prior to Visit  ?Medication Sig Dispense Refill  ? cetirizine (ZYRTEC) 10 MG tablet Take 10 mg by mouth daily.    ? fluticasone (FLONASE) 50 MCG/ACT nasal spray Place into both nostrils daily.    ? ibuprofen (ADVIL) 600 MG tablet Take 1 tablet (600 mg total) by mouth every 6 (six) hours. 30 tablet 0  ? Prenatal Vit-Fe Fumarate-FA (MULTIVITAMIN-PRENATAL) 27-0.8 MG TABS tablet  Take 1 tablet by mouth daily at 12 noon.    ? ?No current facility-administered medications on file prior to visit.  ? ? ?BP 127/79   Pulse 90   Temp 97.6 ?F (36.4 ?C)   Resp 18   Ht 5\' 10"  (1.778 m)   Wt 172 lb 3.2 oz (78.1 kg)   SpO2 99%   BMI 24.71 kg/m?  ?  ? ?   ?Objective:  ? Physical Exam ? ?General- No acute distress. Pleasant patient. ?Neck- Full range of motion, no jvd ?Lungs- Clear, even and unlabored. ?Heart- regular rate and rhythm. ?Neurologic- CNII- XII grossly intact.  ?Abdomen- mild/faint suprapubic tenderness to palpation to palpation. +bs, no rebound or guarding.  ?Back- no cva tenderness. ?   ?Assessment & Plan:  ? ?Patient Instructions  ?For recent frequent urination and faint suprapubic pressure. Urine clear but with high level frequency will get urine culture ? ?Mild decrease gfr and with frequency decided  to get cmp today. ? ?No blood in urine but hx of kidney stone. If renal colic type pain starts would get ct. ? ?Follow up 2 weeks or sooner if needed.  ? ? , PA-C  ?

## 2021-07-27 LAB — URINE CULTURE
MICRO NUMBER:: 13383054
SPECIMEN QUALITY:: ADEQUATE

## 2021-07-27 MED ORDER — CEPHALEXIN 500 MG PO CAPS: 500.0000 mg | ORAL_CAPSULE | Freq: Two times a day (BID) | ORAL | 0 refills | Status: DC

## 2021-07-27 NOTE — Addendum Note (Signed)
Addended by: Gwenevere Abbot on: 07/27/2021 08:06 AM ? ? Modules accepted: Orders ? ?

## 2022-03-17 NOTE — L&D Delivery Note (Addendum)
Operative Delivery Note Pt was noted to be 8.5cm dilated with urge to push. She agreed to epidural. Right after epidural, her pressure and urge intensified. Vertex noted at introitus.  Pt was positioned on her back and staff called in given risk for shouldrer dystocia. She pushed 2-3 times and at 6:05 PM a viable female was delivered via Vaginal, Spontaneous.  Presentation: vertex Cheeks were flush with perineum - staff aware.  Maneuvers used as follows Delivery of the head: 10/20/2022  6:04 PM First maneuver: 10/20/2022  6:04 PM, McRoberts Second maneuver: 10/20/2022  6:04 PM, Suprapubic Pressure Third maneuver: 10/20/2022  6:04 PM, Joseph Art screw Maneuver  Fourth maneuver: 10/20/2022  6:04 PM, Posterior arm delivered Fifth maneuver: ,   Sixth maneuver: ,    Pitocin and TXA ordered  Infant to warmer without delay. PPV initiated   Verbal consent: obtained from patient.  APGAR: 2, 9; weight 10 lb 3.3 oz (4630 g).   Placenta status: , .   Cord: 3vc schultz  with the following complications: none.  Cord pH: pending  Anesthesia:  Epidural  Episiotomy: None Lacerations: 1st degree;Perineal Suture Repair: vicryl rapide 4-0 Est. Blood Loss (mL): 255  Mom to postpartum.  Baby to Couplet care / Skin to Skin. They desire circumcision for baby  Cathrine Muster 10/20/2022, 6:35 PM

## 2022-03-20 LAB — OB RESULTS CONSOLE HIV ANTIBODY (ROUTINE TESTING): HIV: NONREACTIVE

## 2022-03-20 LAB — OB RESULTS CONSOLE RUBELLA ANTIBODY, IGM: Rubella: IMMUNE

## 2022-03-20 LAB — OB RESULTS CONSOLE ABO/RH: RH Type: POSITIVE

## 2022-03-20 LAB — OB RESULTS CONSOLE GC/CHLAMYDIA
Chlamydia: NEGATIVE
Neisseria Gonorrhea: NEGATIVE

## 2022-03-20 LAB — OB RESULTS CONSOLE ANTIBODY SCREEN: Antibody Screen: NEGATIVE

## 2022-03-20 LAB — OB RESULTS CONSOLE RPR: RPR: NONREACTIVE

## 2022-03-20 LAB — OB RESULTS CONSOLE HEPATITIS B SURFACE ANTIGEN: Hepatitis B Surface Ag: NEGATIVE

## 2022-03-20 LAB — HEPATITIS C ANTIBODY: HCV Ab: NEGATIVE

## 2022-09-22 LAB — OB RESULTS CONSOLE GBS: GBS: NEGATIVE

## 2022-10-16 ENCOUNTER — Encounter (HOSPITAL_COMMUNITY): Payer: Self-pay | Admitting: *Deleted

## 2022-10-16 ENCOUNTER — Telehealth (HOSPITAL_COMMUNITY): Payer: Self-pay | Admitting: *Deleted

## 2022-10-16 NOTE — Telephone Encounter (Signed)
Preadmission screen  

## 2022-10-19 ENCOUNTER — Encounter (HOSPITAL_COMMUNITY): Payer: Self-pay | Admitting: Obstetrics and Gynecology

## 2022-10-19 ENCOUNTER — Inpatient Hospital Stay (HOSPITAL_COMMUNITY)
Admission: AD | Admit: 2022-10-19 | Discharge: 2022-10-21 | DRG: 806 | Disposition: A | Discharge status: Home / Self Care | Payer: No Typology Code available for payment source | Attending: Obstetrics and Gynecology | Admitting: Obstetrics and Gynecology

## 2022-10-19 ENCOUNTER — Other Ambulatory Visit: Payer: Self-pay

## 2022-10-19 DIAGNOSIS — O480 Post-term pregnancy: Secondary | ICD-10-CM | POA: Diagnosis present

## 2022-10-19 DIAGNOSIS — D696 Thrombocytopenia, unspecified: Secondary | ICD-10-CM | POA: Diagnosis present

## 2022-10-19 DIAGNOSIS — Z3A40 40 weeks gestation of pregnancy: Secondary | ICD-10-CM | POA: Diagnosis not present

## 2022-10-19 DIAGNOSIS — O9912 Other diseases of the blood and blood-forming organs and certain disorders involving the immune mechanism complicating childbirth: Secondary | ICD-10-CM | POA: Diagnosis present

## 2022-10-19 LAB — CBC
HCT: 40.6 % (ref 36.0–46.0)
Hemoglobin: 13.8 g/dL (ref 12.0–15.0)
MCH: 32.9 pg (ref 26.0–34.0)
MCHC: 34 g/dL (ref 30.0–36.0)
MCV: 96.7 fL (ref 80.0–100.0)
Platelets: 140 10*3/uL — ABNORMAL LOW (ref 150–400)
RBC: 4.2 MIL/uL (ref 3.87–5.11)
RDW: 13.5 % (ref 11.5–15.5)
WBC: 9.2 10*3/uL (ref 4.0–10.5)
nRBC: 0 % (ref 0.0–0.2)

## 2022-10-19 LAB — TYPE AND SCREEN

## 2022-10-19 MED ORDER — ACETAMINOPHEN 325 MG PO TABS: 650.0000 mg | ORAL_TABLET | ORAL | Status: DC | PRN

## 2022-10-19 MED ORDER — TERBUTALINE SULFATE 1 MG/ML IJ SOLN: 0.2500 mg | Freq: Once | INTRAMUSCULAR | Status: DC | PRN

## 2022-10-19 MED ORDER — LACTATED RINGERS IV SOLN: 500.0000 mL | INTRAVENOUS | Status: DC | PRN

## 2022-10-19 MED ORDER — LACTATED RINGERS IV SOLN: INTRAVENOUS | Status: DC

## 2022-10-19 MED ORDER — LIDOCAINE HCL (PF) 1 % IJ SOLN: 30.0000 mL | INTRAMUSCULAR | Status: DC | PRN

## 2022-10-19 MED ORDER — OXYTOCIN-SODIUM CHLORIDE 30-0.9 UT/500ML-% IV SOLN
2.5000 [IU]/h | INTRAVENOUS | Status: DC
  Filled 2022-10-19: qty 500

## 2022-10-19 MED ORDER — OXYTOCIN BOLUS FROM INFUSION
333.0000 mL | Freq: Once | INTRAVENOUS | Status: AC
  Administered 2022-10-20: 333 mL via INTRAVENOUS

## 2022-10-19 MED ORDER — ONDANSETRON HCL 4 MG/2ML IJ SOLN
4.0000 mg | Freq: Four times a day (QID) | INTRAMUSCULAR | Status: DC | PRN
  Administered 2022-10-20: 4 mg via INTRAVENOUS
  Filled 2022-10-19: qty 2

## 2022-10-19 MED ORDER — SOD CITRATE-CITRIC ACID 500-334 MG/5ML PO SOLN: 30.0000 mL | ORAL | Status: DC | PRN

## 2022-10-19 MED ORDER — MISOPROSTOL 25 MCG QUARTER TABLET
25.0000 ug | ORAL_TABLET | ORAL | Status: DC | PRN
  Administered 2022-10-19 – 2022-10-20 (×3): 25 ug via VAGINAL
  Filled 2022-10-19 (×3): qty 1

## 2022-10-19 MED ORDER — FENTANYL CITRATE (PF) 100 MCG/2ML IJ SOLN
100.0000 ug | INTRAMUSCULAR | Status: DC | PRN
  Administered 2022-10-20 (×2): 100 ug via INTRAVENOUS
  Filled 2022-10-19 (×2): qty 2

## 2022-10-20 ENCOUNTER — Inpatient Hospital Stay (HOSPITAL_COMMUNITY): Payer: No Typology Code available for payment source | Admitting: Anesthesiology

## 2022-10-20 ENCOUNTER — Encounter (HOSPITAL_COMMUNITY): Payer: Self-pay | Admitting: Obstetrics and Gynecology

## 2022-10-20 LAB — CBC
HCT: 41.6 % (ref 36.0–46.0)
Hemoglobin: 14.1 g/dL (ref 12.0–15.0)
MCH: 33 pg (ref 26.0–34.0)
MCHC: 33.9 g/dL (ref 30.0–36.0)
MCV: 97.4 fL (ref 80.0–100.0)
Platelets: 137 10*3/uL — ABNORMAL LOW (ref 150–400)
RBC: 4.27 MIL/uL (ref 3.87–5.11)
RDW: 13.4 % (ref 11.5–15.5)
WBC: 16.8 10*3/uL — ABNORMAL HIGH (ref 4.0–10.5)
nRBC: 0 % (ref 0.0–0.2)

## 2022-10-20 MED ORDER — BENZOCAINE-MENTHOL 20-0.5 % EX AERO
1.0000 | INHALATION_SPRAY | CUTANEOUS | Status: DC | PRN
  Administered 2022-10-20: 1 via TOPICAL
  Filled 2022-10-20: qty 56

## 2022-10-20 MED ORDER — FENTANYL-BUPIVACAINE-NACL 0.5-0.125-0.9 MG/250ML-% EP SOLN: 12.0000 mL/h | EPIDURAL | Status: DC | PRN

## 2022-10-20 MED ORDER — TERBUTALINE SULFATE 1 MG/ML IJ SOLN: 0.2500 mg | Freq: Once | INTRAMUSCULAR | Status: DC | PRN

## 2022-10-20 MED ORDER — ASPIRIN 81 MG PO TBEC: 81.0000 mg | DELAYED_RELEASE_TABLET | Freq: Every day | ORAL | Status: DC

## 2022-10-20 MED ORDER — DIPHENHYDRAMINE HCL 50 MG/ML IJ SOLN: 12.5000 mg | INTRAMUSCULAR | Status: DC | PRN

## 2022-10-20 MED ORDER — TRANEXAMIC ACID-NACL 1000-0.7 MG/100ML-% IV SOLN: 1000.0000 mg | INTRAVENOUS | Status: DC

## 2022-10-20 MED ORDER — OXYTOCIN-SODIUM CHLORIDE 30-0.9 UT/500ML-% IV SOLN: 2.5000 [IU]/h | INTRAVENOUS | Status: DC | PRN

## 2022-10-20 MED ORDER — IBUPROFEN 600 MG PO TABS
600.0000 mg | ORAL_TABLET | Freq: Four times a day (QID) | ORAL | Status: DC
  Administered 2022-10-20 – 2022-10-21 (×4): 600 mg via ORAL
  Filled 2022-10-20 (×4): qty 1

## 2022-10-20 MED ORDER — OXYCODONE HCL 5 MG PO TABS: 5.0000 mg | ORAL_TABLET | ORAL | Status: DC | PRN

## 2022-10-20 MED ORDER — OXYCODONE HCL 5 MG PO TABS: 10.0000 mg | ORAL_TABLET | ORAL | Status: DC | PRN

## 2022-10-20 MED ORDER — WITCH HAZEL-GLYCERIN EX PADS: 1.0000 | MEDICATED_PAD | CUTANEOUS | Status: DC | PRN

## 2022-10-20 MED ORDER — LACTATED RINGERS IV SOLN
500.0000 mL | Freq: Once | INTRAVENOUS | Status: AC
  Administered 2022-10-20: 500 mL via INTRAVENOUS

## 2022-10-20 MED ORDER — DIPHENHYDRAMINE HCL 25 MG PO CAPS: 25.0000 mg | ORAL_CAPSULE | Freq: Four times a day (QID) | ORAL | Status: DC | PRN

## 2022-10-20 MED ORDER — SENNOSIDES-DOCUSATE SODIUM 8.6-50 MG PO TABS
2.0000 | ORAL_TABLET | Freq: Every day | ORAL | Status: DC
  Administered 2022-10-21: 2 via ORAL
  Filled 2022-10-20: qty 2

## 2022-10-20 MED ORDER — COCONUT OIL OIL: 1.0000 | TOPICAL_OIL | Status: DC | PRN

## 2022-10-20 MED ORDER — LIDOCAINE HCL (PF) 1 % IJ SOLN
INTRAMUSCULAR | Status: DC | PRN
  Administered 2022-10-20: 8 mL via EPIDURAL

## 2022-10-20 MED ORDER — PHENYLEPHRINE 80 MCG/ML (10ML) SYRINGE FOR IV PUSH (FOR BLOOD PRESSURE SUPPORT): 80.0000 ug | PREFILLED_SYRINGE | INTRAVENOUS | Status: DC | PRN

## 2022-10-20 MED ORDER — OXYTOCIN-SODIUM CHLORIDE 30-0.9 UT/500ML-% IV SOLN
1.0000 m[IU]/min | INTRAVENOUS | Status: DC
  Administered 2022-10-20: 2 m[IU]/min via INTRAVENOUS
  Filled 2022-10-20: qty 500

## 2022-10-20 MED ORDER — TETANUS-DIPHTH-ACELL PERTUSSIS 5-2.5-18.5 LF-MCG/0.5 IM SUSY: 0.5000 mL | PREFILLED_SYRINGE | Freq: Once | INTRAMUSCULAR | Status: DC

## 2022-10-20 MED ORDER — FENTANYL-BUPIVACAINE-NACL 0.5-0.125-0.9 MG/250ML-% EP SOLN
EPIDURAL | Status: AC
  Filled 2022-10-20: qty 250

## 2022-10-20 MED ORDER — TRANEXAMIC ACID-NACL 1000-0.7 MG/100ML-% IV SOLN
INTRAVENOUS | Status: AC
  Administered 2022-10-20: 1000 mg
  Filled 2022-10-20: qty 100

## 2022-10-20 MED ORDER — ONDANSETRON HCL 4 MG PO TABS: 4.0000 mg | ORAL_TABLET | ORAL | Status: DC | PRN

## 2022-10-20 MED ORDER — ZOLPIDEM TARTRATE 5 MG PO TABS: 5.0000 mg | ORAL_TABLET | Freq: Every evening | ORAL | Status: DC | PRN

## 2022-10-20 MED ORDER — ACETAMINOPHEN 325 MG PO TABS
650.0000 mg | ORAL_TABLET | ORAL | Status: DC | PRN
  Administered 2022-10-20: 650 mg via ORAL
  Filled 2022-10-20: qty 2

## 2022-10-20 MED ORDER — EPHEDRINE 5 MG/ML INJ: 10.0000 mg | INTRAVENOUS | Status: DC | PRN

## 2022-10-20 MED ORDER — ONDANSETRON HCL 4 MG/2ML IJ SOLN: 4.0000 mg | INTRAMUSCULAR | Status: DC | PRN

## 2022-10-20 MED ORDER — OXYTOCIN-SODIUM CHLORIDE 30-0.9 UT/500ML-% IV SOLN
60.0000 [IU]/h | INTRAVENOUS | Status: DC
  Administered 2022-10-20: 60 [IU]/h via INTRAVENOUS

## 2022-10-20 MED ORDER — METHYLERGONOVINE MALEATE 0.2 MG/ML IJ SOLN
INTRAMUSCULAR | Status: AC
  Administered 2022-10-20: 0.2 mg
  Filled 2022-10-20: qty 1

## 2022-10-20 MED ORDER — DIBUCAINE (PERIANAL) 1 % EX OINT: 1.0000 | TOPICAL_OINTMENT | CUTANEOUS | Status: DC | PRN

## 2022-10-20 MED ORDER — METHYLERGONOVINE MALEATE 0.2 MG/ML IJ SOLN: 0.2000 mg | Freq: Once | INTRAMUSCULAR | Status: DC

## 2022-10-20 MED ORDER — FENTANYL-BUPIVACAINE-NACL 0.5-0.125-0.9 MG/250ML-% EP SOLN
12.0000 mL/h | EPIDURAL | Status: DC | PRN
  Administered 2022-10-20: 12 mL/h via EPIDURAL

## 2022-10-20 MED ORDER — SIMETHICONE 80 MG PO CHEW: 80.0000 mg | CHEWABLE_TABLET | ORAL | Status: DC | PRN

## 2022-10-20 MED ORDER — MISOPROSTOL 25 MCG QUARTER TABLET
25.0000 ug | ORAL_TABLET | Freq: Once | ORAL | Status: AC
  Administered 2022-10-20: 25 ug via BUCCAL
  Filled 2022-10-20: qty 1

## 2022-10-20 MED ORDER — LACTATED RINGERS IV SOLN: INTRAVENOUS | Status: DC

## 2022-10-20 NOTE — Progress Notes (Signed)
Chaplain responded to Neonatal Code Blue at 1811.  Upon arrival Chaplain learned from unit secretary baby breathing fine.  Chaplain not needed.  Vernell Morgans Chaplalin

## 2022-10-20 NOTE — Anesthesia Preprocedure Evaluation (Signed)
Anesthesia Evaluation  Patient identified by MRN, date of birth, ID band Patient awake    Reviewed: Allergy & Precautions, H&P , NPO status , Patient's Chart, lab work & pertinent test results, reviewed documented beta blocker date and time   Airway Mallampati: II  TM Distance: >3 FB Neck ROM: full    Dental no notable dental hx.    Pulmonary neg pulmonary ROS   Pulmonary exam normal breath sounds clear to auscultation       Cardiovascular negative cardio ROS Normal cardiovascular exam Rhythm:regular Rate:Normal     Neuro/Psych Seizures -,  negative neurological ROS  negative psych ROS   GI/Hepatic negative GI ROS, Neg liver ROS,GERD  ,,  Endo/Other  negative endocrine ROS    Renal/GU Renal diseasenegative Renal ROS  negative genitourinary   Musculoskeletal  (+) Arthritis ,    Abdominal   Peds  Hematology negative hematology ROS (+)   Anesthesia Other Findings   Reproductive/Obstetrics (+) Pregnancy                             Anesthesia Physical Anesthesia Plan  ASA: 3  Anesthesia Plan: Epidural   Post-op Pain Management: Minimal or no pain anticipated   Induction: Intravenous  PONV Risk Score and Plan: 2  Airway Management Planned:   Additional Equipment: None  Intra-op Plan:   Post-operative Plan:   Informed Consent: I have reviewed the patients History and Physical, chart, labs and discussed the procedure including the risks, benefits and alternatives for the proposed anesthesia with the patient or authorized representative who has indicated his/her understanding and acceptance.       Plan Discussed with: Anesthesiologist and CRNA  Anesthesia Plan Comments:        Anesthesia Quick Evaluation

## 2022-10-20 NOTE — H&P (Signed)
Brittany Mccarthy is a 67 y.[id number] female presenting for elective IOL at 56 4/7wks. She is dated per 10 week [place].  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 Neg APLS. Used lovenox in past pregnancy but opted for baby asa in this one; prog vagnally in first trimester  History of LGA babies: proven pelvic to 9lbs 8oz at 41 weeks with first child and 9lbs 4oz at 10 2/7wks with second. Was at 7lbs 15oz ( 94%ile) at 35 weeks  Mild gestational thrombocytopenia: 140K today (129K @38w ) Recently treated for BV and yeast infection a week ago  She is GBS neg.   OB History     Gravida  8   Para  2   Term  2   Preterm      AB  5   Living  2      SAB  5   IAB      Ectopic      Multiple  0   Live Births  2          Past Medical History:  Diagnosis Date   Arthritis    GERD (gastroesophageal reflux disease)    Kidney stones    Seizures (HCC)    Febrile seizures when young   Past Surgical History:  Procedure Laterality Date   EYE SURGERY Bilateral    x 2   HERNIA REPAIR     WISDOM TOOTH EXTRACTION     Family History: family history includes Healthy in her father and mother. Social History:  reports that she has never smoked. She has never used smokeless tobacco. She reports that she does not drink alcohol and does not use drugs.     Maternal Diabetes: No Genetic Screening: Normal Maternal Ultrasounds/Referrals: Normal Fetal Ultrasounds or other Referrals:  None Maternal Substance Abuse:  No Significant Maternal Medications:  None Significant Maternal Lab Results:  Group B Strep negative Number of Prenatal Visits:greater than 3 verified prenatal visits Other Comments:  None  Review of Systems  Constitutional:  Negative for activity change and fatigue.  Eyes:  Negative for photophobia and visual disturbance.  Respiratory:  Negative for chest tightness and shortness of breath.    Cardiovascular:  Positive for leg swelling. Negative for chest pain and palpitations.  Gastrointestinal:  Negative for abdominal pain.  Genitourinary:  Negative for vaginal bleeding.  Musculoskeletal:  Negative for back pain.  Neurological:  Negative for seizures and numbness.  Psychiatric/Behavioral:  The patient is not nervous/anxious.    Maternal Medical History:  Reason for admission: Postdates - elective IOL   Contractions: Onset was 3-5 hours ago.   Frequency: irregular.   Perceived severity is mild.   Fetal activity: Perceived fetal activity is normal.   Prenatal complications: no prenatal complications Prenatal Complications - Diabetes: none.   Dilation: 2 Effacement (%): Thick Station: -3 Exam by:: Lajuana Matte, RNC Blood pressure 118/67, pulse 78, temperature 98.1 F (36.7 C), temperature source Oral, resp. rate 18, height 5\' 10"  (1.778 m), weight 100.2 kg, currently breastfeeding. Maternal Exam:  Uterine Assessment: Contraction strength is mild.  Contraction frequency is irregular.  Abdomen: Patient reports no abdominal tenderness. Estimated fetal weight is AGA.   Fetal presentation: vertex Introitus: Normal vulva. Vulva is negative for condylomata and lesion.  Normal vagina.  Vagina is negative for condylomata.  Pelvis: adequate for delivery.   Cervix: Cervix evaluated by digital exam.  Fetal Exam Fetal Monitor Review: Baseline rate: 130.  Variability: moderate (6-25 bpm).   Pattern: accelerations present.   Fetal State Assessment: Category I - tracings are normal.   Physical Exam Vitals and nursing note reviewed. Exam conducted with a chaperone present.  Constitutional:      Appearance: Normal appearance. She is normal weight.  Cardiovascular:     Rate and Rhythm: Normal rate.     Pulses: Normal pulses.  Pulmonary:     Effort: Pulmonary effort is normal.  Genitourinary:    General: Normal vulva.  Vulva is no lesion.  Musculoskeletal:         General: Normal range of motion.     Cervical back: Normal range of motion.  Skin:    General: Skin is warm.     Capillary Refill: Capillary refill takes 2 to 3 seconds.  Neurological:     General: No focal deficit present.     Mental Status: She is alert and oriented to person, place, and time. Mental status is at baseline.  Psychiatric:        Mood and Affect: Mood normal.        Behavior: Behavior normal.        Thought Content: Thought content normal.        Judgment: Judgment normal.     Prenatal labs: ABO, Rh: --/--/A POS (08/04 2304) Antibody: NEG (08/04 2304) Rubella: Immune (01/04 0000) RPR: Nonreactive (01/04 0000)  HBsAg: Negative (01/04 0000)  HIV: Non-reactive (01/04 0000)  GBS: Negative/-- (07/08 0000)   Assessment/Plan: 68GE [id number] female at 17 4/7wks for elective postterm pregnancy  - Admitted overnight and s/p 2 doses of vaginal cytotec for ripening. Received third dose at 8am ( 25v/25oral) - GBS neg - Epidural prn  - Anticipate svd will monitor for bleeding given gestational thrombocytopenia and history of LGA babies     W  10/20/2022, 9:05 AM

## 2022-10-20 NOTE — Anesthesia Procedure Notes (Signed)
Epidural Patient location during procedure: OB Start time: 10/20/2022 6:56 PM End time: 10/20/2022 6:03 PM  Staffing Anesthesiologist: Bethena Midget, MD  Preanesthetic Checklist Completed: patient identified, IV checked, site marked, risks and benefits discussed, surgical consent, monitors and equipment checked, pre-op evaluation and timeout performed  Epidural Patient position: sitting Prep: DuraPrep and site prepped and draped Patient monitoring: continuous pulse ox and blood pressure Approach: midline Location: L3-L4 Injection technique: LOR air  Needle:  Needle type: Tuohy  Needle gauge: 17 G Needle length: 9 cm and 9 Needle insertion depth: 8 cm Catheter type: closed end flexible Catheter size: 19 Gauge Catheter at skin depth: 13 cm Test dose: negative  Assessment Events: blood not aspirated, no cerebrospinal fluid, injection not painful, no injection resistance, no paresthesia and negative IV test

## 2022-10-20 NOTE — Progress Notes (Signed)
Brittany Mccarthy is a 37 y.o. [id number] at [redacted]w[redacted]d. She reports the contractions are getting more intense. +FMs  Objective: BP 108/64   Pulse 85   Temp 98.6 F (37 C) (Oral)   Resp 18   Ht 5\' 10"  (1.778 m)   Wt 100.2 kg   BMI 31.68 kg/m  No intake/output data recorded. No intake/output data recorded.  FHT:  FHR: 140 bpm, variability: moderate,  accelerations:  Present,  decelerations:  Absent UC:   regular, every 2-3 minutes SVE:   Dilation: 4 Effacement (%): 60 Station: -2 Exam by:: , DO  Labs: Lab Results  Component Value Date   WBC 9.2 10/19/2022   HGB 13.8 10/19/2022   HCT 40.6 10/19/2022   MCV 96.7 10/19/2022   PLT 140 (L) 10/19/2022    Assessment / Plan: Progressing well s/p cytotec x 3 AROM with clear fluid noted/ Monitor for now and augment with pitocin if indicated Pain control prn pt request  Cathrine Muster, DO 10/20/2022, 12:35 PM

## 2022-10-21 MED ORDER — IBUPROFEN 600 MG PO TABS: 600.0000 mg | ORAL_TABLET | Freq: Four times a day (QID) | ORAL | 0 refills | Status: DC

## 2022-10-21 NOTE — Discharge Summary (Signed)
Postpartum Discharge Summary    Patient Name: Brittany Mccarthy DOB: 1985-09-04 MRN: [id number]  Date of admission: 10/19/2022 Delivery date:10/20/2022 Delivering provider: Pryor Ochoa [facility] Date of discharge: 10/21/2022  Admitting diagnosis: Labor and delivery, indication for care [O75.9] Intrauterine pregnancy: [redacted]w[redacted]d     Secondary diagnosis:  Principal Problem:   Labor and delivery, indication for care Active Problems:   SVD (spontaneous vaginal delivery)    Discharge diagnosis: Term Pregnancy Delivered and shoulder dystocia                                                Hospital course: Induction of Labor With Vaginal Delivery   37 y.o. yo [name] at [redacted]w[redacted]d was admitted to the hospital 10/19/2022 for induction of labor.  Indication for induction: Elective.  Patient had an labor course complicated by shoulder dystocia Membrane Rupture Time/Date: 12:30 PM,10/20/2022  Delivery Method:Vaginal, Spontaneous Episiotomy: None Lacerations:  1st degree;Perineal Details of delivery can be found in separate delivery note.  Patient had a postpartum course complicated by nothing. Patient is discharged home 10/21/22.  Newborn Data: Birth date:10/20/2022 Birth time:6:05 PM Gender:Female Living status:Living Apgars:2 ,9  Weight:4630 g  Physical exam  Vitals:   10/21/22 0236 10/21/22 0645 10/21/22 1000 10/21/22 1308  BP: (!) 111/58 123/76 111/65 120/77  Pulse: 74 74 72 81  Resp: 20 20 18 18   Temp: 98.5 F (36.9 C) 97.9 F (36.6 C) 98.3 F (36.8 C) 97.8 F (36.6 C)  TempSrc: Oral Oral Oral Oral  SpO2: 98% 97%  97%  Weight:      Height:       General: alert Lochia: appropriate Uterine Fundus: firm Labs: Lab Results  Component Value Date   WBC 12.3 (H) 10/21/2022   HGB 14.0 10/21/2022   HCT 41.7 10/21/2022   MCV 98.3 10/21/2022   PLT 131 (L) 10/21/2022      Latest Ref Rng & Units 07/25/2021   11:05 AM  CMP  Glucose 70 - 99 mg/dL 78   BUN 6 - 23 mg/dL 16   Creatinine 1.47 -  1.20 mg/dL 8.29   Sodium 562 - 130 mEq/L 140   Potassium 3.5 - 5.1 mEq/L 4.2   Chloride 96 - 112 mEq/L 104   CO2 19 - 32 mEq/L 29   Calcium 8.4 - 10.5 mg/dL 9.3   Total Protein 6.0 - 8.3 g/dL 7.2   Total Bilirubin 0.2 - 1.2 mg/dL 0.5   Alkaline Phos 39 - 117 U/L 118   AST 0 - 37 U/L 15   ALT 0 - 35 U/L 17    Edinburgh Score:    10/20/2022    8:30 PM  Edinburgh Postnatal Depression Scale Screening Tool  I have been able to laugh and see the funny side of things. 0  I have looked forward with enjoyment to things. 0  I have blamed myself unnecessarily when things went wrong. 0  I have been anxious or worried for no good reason. 0  I have felt scared or panicky for no good reason. 0  Things have been getting on top of me. 1  I have been so unhappy that I have had difficulty sleeping. 0  I have felt sad or miserable. 0  I have been so unhappy that I have been crying. 0  The thought of harming myself has  occurred to me. 0  Edinburgh Postnatal Depression Scale Total 1      After visit meds:  Allergies as of 10/21/2022       Reactions   Fish Allergy Anaphylaxis        Medication List     STOP taking these medications    aspirin EC 81 MG tablet   metroNIDAZOLE 500 MG tablet Commonly known as: FLAGYL       TAKE these medications    cetirizine 10 MG tablet Commonly known as: ZYRTEC Take 10 mg by mouth daily as needed for allergies.   ibuprofen 600 MG tablet Commonly known as: ADVIL Take 1 tablet (600 mg total) by mouth every 6 (six) hours.   PRENATAL PO Take 1 tablet by mouth daily.         Discharge home in stable condition Infant Feeding: Breast Infant Disposition:home with mother Discharge instruction: per After Visit Summary and Postpartum booklet. Activity: Advance as tolerated. Pelvic rest for 6 weeks.  Diet: routine diet Postpartum Appointment:6 weeks Follow up Visit:  Follow-up Information     [facility] Schedule an  appointment as soon as possible for a visit in 6 week(s).   Contact information: [address]  [address] [place] [address] [phone]                     10/21/2022 [name], MD

## 2022-10-21 NOTE — Anesthesia Postprocedure Evaluation (Signed)
Anesthesia Post Note  Patient: [name]  Procedure(s) Performed: AN AD HOC LABOR EPIDURAL     Patient location during evaluation: Mother Baby Anesthesia Type: Epidural Level of consciousness: awake and alert Pain management: pain level controlled Vital Signs Assessment: post-procedure vital signs reviewed and stable Respiratory status: spontaneous breathing, nonlabored ventilation and respiratory function stable Cardiovascular status: stable Postop Assessment: no headache, no backache and epidural receding Anesthetic complications: no   No notable events documented.  Last Vitals:  Vitals:   10/21/22 0236 10/21/22 0645  BP: (!) 111/58 123/76  Pulse: 74 74  Resp: 20 20  Temp: 36.9 C 36.6 C  SpO2: 98% 97%    Last Pain:  Vitals:   10/21/22 0645  TempSrc: Oral  PainSc: 1    Pain Goal:                   ,

## 2022-10-21 NOTE — Lactation Note (Signed)
This note was copied from a baby's chart. Lactation Consultation Note  Patient Name: Brittany Mccarthy Today's Date: 10/21/2022 Age:38 hours Reason for consult: Initial assessment;Other (Comment);Term;Infant weight loss (LGA, AMA, - 1.86% WL)  Visited with family of 71 hours old FT female; Brittany Mccarthy is a P3 and experienced breastfeeding. She reports breastfeeding is going well, noticed there is no LATCH score documented by staff. Unable to observe a latch at this time, baby was taking to the nursery for his circumcision. Couplet might be going home today. Reviewed discharge education and the importance of consistent latching and "keeping the milk flowing" for the prevention of engorgement and to protect her supply. Revised feeding cues, normal newborn behavior, cluster feeding, lactogenesis II and anticipatory guidelines. Brittany Mccarthy politely declined an LC OP, she voiced she's already working with private [facility] Beverley Fiedler and will F/U with her. FOB and family present. All questions and concerns answered, family to contact [facility] services PRN.  Maternal Data Has patient been taught Hand Expression?: Yes Does the patient have breastfeeding experience prior to this delivery?: Yes How long did the patient breastfeed?: 1 year  Feeding Mother's Current Feeding Choice: Breast Milk  Interventions Interventions: Breast feeding basics reviewed;Education;LC Services brochure  Discharge Discharge Education: Engorgement and breast care;Warning signs for feeding baby;Outpatient recommendation Pump: Personal (Spectra)  Consult Status Consult Status: Complete Date: 10/21/22 Follow-up type: In-patient    Venetia Constable 10/21/2022, 3:18 PM

## 2022-10-21 NOTE — Progress Notes (Signed)
PPD #1 No problems, would like to go home this evening Afeb, VSS Fundus firm, NT at U-1 Continue routine postpartum care, d/c home this pm if baby ok to go and circumcision completed

## 2022-10-21 NOTE — Discharge Instructions (Signed)
As per discharge pamphlet °

## 2022-10-23 ENCOUNTER — Inpatient Hospital Stay (HOSPITAL_COMMUNITY): Payer: No Typology Code available for payment source

## 2022-11-13 ENCOUNTER — Telehealth (HOSPITAL_COMMUNITY): Payer: Self-pay

## 2022-11-13 NOTE — Telephone Encounter (Signed)
11/13/2022 1853  Name: Brittany Mccarthy MRN: [id number] DOB: Jun 24, 1985  Reason for Call:  Transition of Care Hospital Discharge Call  Contact Status: Patient Contact Status: Message  Language assistant needed: Interpreter Mode: Interpreter Not Needed        Follow-Up Questions:    Inocente Salles Postnatal Depression Scale:  In the Past 7 Days:    PHQ2-9 Depression Scale:     Discharge Follow-up:    Post-discharge interventions: NA  Signature  Signe Colt

## 2023-07-23 ENCOUNTER — Ambulatory Visit: Admitting: Podiatry

## 2023-07-23 ENCOUNTER — Ambulatory Visit (INDEPENDENT_AMBULATORY_CARE_PROVIDER_SITE_OTHER)

## 2023-07-23 ENCOUNTER — Encounter: Payer: Self-pay | Admitting: Podiatry

## 2023-07-23 DIAGNOSIS — M722 Plantar fascial fibromatosis: Secondary | ICD-10-CM

## 2023-07-23 MED ORDER — TRIAMCINOLONE ACETONIDE 40 MG/ML IJ SUSP
40.0000 mg | Freq: Once | INTRAMUSCULAR | Status: AC
Start: 2023-07-23 — End: 2023-07-23
  Administered 2023-07-23: 40 mg

## 2023-07-23 MED ORDER — MELOXICAM 15 MG PO TABS: 15.0000 mg | ORAL_TABLET | Freq: Every day | ORAL | 3 refills | Status: DC

## 2023-07-23 MED ORDER — METHYLPREDNISOLONE 4 MG PO TBPK: ORAL_TABLET | ORAL | 0 refills | Status: AC

## 2023-07-27 NOTE — Progress Notes (Signed)
  Subjective:  Patient ID: Brittany Mccarthy, female    DOB: 1985/10/14,  MRN: [id number] HPI Chief Complaint  Patient presents with   Foot Pain    Arch/plantar lateral right - history of PF left, aching x 2-3 months, pain AM, tightness PM, rolls arch with tennis ball a,d also tried KT tape   New Patient (Initial Visit)    Est pt 2017    38 y.o. female presents with the above complaint.   ROS: Denies fever chills nausea vomit muscle aches pains calf pain back pain chest pain shortness of breath.  Past Medical History:  Diagnosis Date   Arthritis    GERD (gastroesophageal reflux disease)    Kidney stones    Seizures (HCC)    Febrile seizures when young   Past Surgical History:  Procedure Laterality Date   EYE SURGERY Bilateral    x 2   HERNIA REPAIR     WISDOM TOOTH EXTRACTION      Current Outpatient Medications:    meloxicam  (MOBIC ) 15 MG tablet, Take 1 tablet (15 mg total) by mouth daily., Disp: 30 tablet, Rfl: 3   methylPREDNISolone  (MEDROL  DOSEPAK) 4 MG TBPK tablet, 6 day dose pack - take as directed, Disp: 21 tablet, Rfl: 0   SPRINTEC 28 0.25-35 MG-MCG tablet, Take 1 tablet by mouth daily., Disp: , Rfl:    cetirizine (ZYRTEC) 10 MG tablet, Take 10 mg by mouth daily as needed for allergies., Disp: , Rfl:   Allergies  Allergen Reactions   Fish Allergy Anaphylaxis   Review of Systems Objective:  There were no vitals filed for this visit.  General: Well developed, nourished, in no acute distress, alert and oriented x3   Dermatological: Skin is warm, dry and supple bilateral. Nails x 10 are well maintained; remaining integument appears unremarkable at this time. There are no open sores, no preulcerative lesions, no rash or signs of infection present.  Vascular: Dorsalis Pedis artery and Posterior Tibial artery pedal pulses are 2/4 bilateral with immedate capillary fill time. Pedal hair growth present. No varicosities and no lower extremity edema present bilateral.    Neruologic: Grossly intact via light touch bilateral. Vibratory intact via tuning fork bilateral. Protective threshold with Semmes Wienstein monofilament intact to all pedal sites bilateral. Patellar and Achilles deep tendon reflexes 2+ bilateral. No Babinski or clonus noted bilateral.   Musculoskeletal: No gross boney pedal deformities bilateral. No pain, crepitus, or limitation noted with foot and ankle range of motion bilateral. Muscular strength 5/5 in all groups tested bilateral.  Moderate to severe pain on palpation medial calcaneal tubercle of the right heel.  Minimally so on the left.  She has no pain on palpation medial lateral compression of the calcaneus.   Gait: Unassisted, Nonantalgic.    Radiographs:  Radiographs taken today demonstrate osseously mature individual with good rectus foot type.  Soft tissue increase in density at the plantar fascial calcaneal insertion site of the right heel more so than the left.  Small plantar distally oriented calcaneal spurs are noted.  Assessment & Plan:   Assessment: Plantar fasciitis right over left  Plan: Discussed etiology pathology conservative versus surgical therapies discussed appropriate shoe gear stretching exercise ice therapy and shoe gear modifications.  Injected the plantar fascia on the right today 20 mg Kenalog  5 mg of Marcaine  placed her on methylprednisolone  to be followed by meloxicam .  Started her on a plantar fascia brace as well.     Brittany Mccarthy T. [place], [place]

## 2023-08-13 ENCOUNTER — Institutional Professional Consult (permissible substitution): Admitting: Plastic Surgery

## 2023-09-03 ENCOUNTER — Ambulatory Visit: Admitting: Podiatry

## 2023-09-09 ENCOUNTER — Institutional Professional Consult (permissible substitution): Admitting: Plastic Surgery

## 2023-11-16 ENCOUNTER — Other Ambulatory Visit: Payer: Self-pay | Admitting: Podiatry
